# Patient Record
Sex: Female | Born: 1950 | Race: White | Hispanic: No | Smoking: Former smoker
Health system: Southern US, Community
[De-identification: ages and names within clinical notes are randomized; demographics above are authoritative.]

## PROBLEM LIST (undated history)

## (undated) DIAGNOSIS — I809 Phlebitis and thrombophlebitis of unspecified site: Secondary | ICD-10-CM

## (undated) DIAGNOSIS — M810 Age-related osteoporosis without current pathological fracture: Secondary | ICD-10-CM

## (undated) DIAGNOSIS — I509 Heart failure, unspecified: Secondary | ICD-10-CM

## (undated) DIAGNOSIS — I4729 Other ventricular tachycardia: Secondary | ICD-10-CM

## (undated) DIAGNOSIS — I351 Nonrheumatic aortic (valve) insufficiency: Secondary | ICD-10-CM

## (undated) DIAGNOSIS — E079 Disorder of thyroid, unspecified: Secondary | ICD-10-CM

## (undated) DIAGNOSIS — J449 Chronic obstructive pulmonary disease, unspecified: Secondary | ICD-10-CM

## (undated) DIAGNOSIS — K219 Gastro-esophageal reflux disease without esophagitis: Secondary | ICD-10-CM

## (undated) DIAGNOSIS — I071 Rheumatic tricuspid insufficiency: Secondary | ICD-10-CM

## (undated) DIAGNOSIS — Z9889 Other specified postprocedural states: Secondary | ICD-10-CM

## (undated) DIAGNOSIS — M1990 Unspecified osteoarthritis, unspecified site: Secondary | ICD-10-CM

## (undated) DIAGNOSIS — Z8639 Personal history of other endocrine, nutritional and metabolic disease: Secondary | ICD-10-CM

## (undated) DIAGNOSIS — K2270 Barrett's esophagus without dysplasia: Secondary | ICD-10-CM

## (undated) DIAGNOSIS — F419 Anxiety disorder, unspecified: Secondary | ICD-10-CM

## (undated) DIAGNOSIS — D801 Nonfamilial hypogammaglobulinemia: Secondary | ICD-10-CM

## (undated) DIAGNOSIS — J45909 Unspecified asthma, uncomplicated: Secondary | ICD-10-CM

## (undated) DIAGNOSIS — E039 Hypothyroidism, unspecified: Secondary | ICD-10-CM

## (undated) HISTORY — PX: BREAST BIOPSY: SHX20

## (undated) HISTORY — PX: ANKLE SURGERY: SHX546

## (undated) HISTORY — PX: HEMORRHOID SURGERY: SHX153

## (undated) HISTORY — PX: TONSILLECTOMY: SUR1361

---

## 2004-11-05 HISTORY — PX: BREAST BIOPSY: SHX20

## 2010-05-15 ENCOUNTER — Ambulatory Visit: Payer: Self-pay | Admitting: Family Medicine

## 2010-05-24 ENCOUNTER — Ambulatory Visit: Payer: Self-pay | Admitting: Family Medicine

## 2010-11-18 ENCOUNTER — Inpatient Hospital Stay: Payer: Self-pay | Admitting: Internal Medicine

## 2011-02-12 ENCOUNTER — Ambulatory Visit: Payer: Self-pay | Admitting: Internal Medicine

## 2011-02-19 ENCOUNTER — Ambulatory Visit: Payer: Self-pay | Admitting: Internal Medicine

## 2011-02-27 ENCOUNTER — Ambulatory Visit: Payer: Self-pay | Admitting: Family Medicine

## 2011-04-04 ENCOUNTER — Ambulatory Visit: Payer: Self-pay | Admitting: Family Medicine

## 2011-04-05 ENCOUNTER — Emergency Department: Payer: Self-pay

## 2011-04-05 ENCOUNTER — Ambulatory Visit: Payer: Self-pay | Admitting: Internal Medicine

## 2013-07-11 ENCOUNTER — Emergency Department: Payer: Self-pay | Admitting: Emergency Medicine

## 2013-08-18 ENCOUNTER — Emergency Department: Payer: Self-pay | Admitting: Emergency Medicine

## 2013-08-18 LAB — URINALYSIS, COMPLETE
Bacteria: NONE SEEN
Bilirubin,UR: NEGATIVE
Glucose,UR: NEGATIVE mg/dL (ref 0–75)
Ketone: NEGATIVE
Leukocyte Esterase: NEGATIVE
Ph: 6 (ref 4.5–8.0)
Protein: NEGATIVE
RBC,UR: 1 /HPF (ref 0–5)
Specific Gravity: 1.008 (ref 1.003–1.030)
WBC UR: 1 /HPF (ref 0–5)

## 2013-08-18 LAB — COMPREHENSIVE METABOLIC PANEL
Albumin: 3.8 g/dL (ref 3.4–5.0)
Alkaline Phosphatase: 78 U/L (ref 50–136)
BUN: 10 mg/dL (ref 7–18)
Bilirubin,Total: 0.4 mg/dL (ref 0.2–1.0)
Co2: 30 mmol/L (ref 21–32)
EGFR (African American): >60
Osmolality: 279 (ref 275–301)
Potassium: 3.5 mmol/L (ref 3.5–5.1)
SGPT (ALT): 19 U/L (ref 12–78)

## 2013-08-18 LAB — CBC
HCT: 38.4 % (ref 35.0–47.0)
MCH: 30.8 pg (ref 26.0–34.0)
MCHC: 33.2 g/dL (ref 32.0–36.0)
MCV: 93 fL (ref 80–100)
Platelet: 205 10*3/uL (ref 150–440)
RBC: 4.14 10*6/uL (ref 3.80–5.20)
WBC: 14.6 10*3/uL — ABNORMAL HIGH (ref 3.6–11.0)

## 2013-08-18 LAB — LIPASE, BLOOD: Lipase: 162 U/L (ref 73–393)

## 2014-04-28 DIAGNOSIS — J302 Other seasonal allergic rhinitis: Secondary | ICD-10-CM | POA: Insufficient documentation

## 2014-04-28 DIAGNOSIS — J4520 Mild intermittent asthma, uncomplicated: Secondary | ICD-10-CM | POA: Insufficient documentation

## 2014-04-28 DIAGNOSIS — B009 Herpesviral infection, unspecified: Secondary | ICD-10-CM | POA: Insufficient documentation

## 2014-04-28 DIAGNOSIS — K2100 Gastro-esophageal reflux disease with esophagitis, without bleeding: Secondary | ICD-10-CM | POA: Insufficient documentation

## 2014-04-28 DIAGNOSIS — F419 Anxiety disorder, unspecified: Secondary | ICD-10-CM | POA: Insufficient documentation

## 2015-12-29 ENCOUNTER — Encounter: Payer: Self-pay | Admitting: *Deleted

## 2015-12-29 ENCOUNTER — Ambulatory Visit
Admission: EM | Admit: 2015-12-29 | Discharge: 2015-12-29 | Disposition: A | Discharge status: Home / Self Care | Payer: Medicare Other | Attending: Internal Medicine | Admitting: Internal Medicine

## 2015-12-29 DIAGNOSIS — S91114A Laceration without foreign body of right lesser toe(s) without damage to nail, initial encounter: Secondary | ICD-10-CM

## 2015-12-29 HISTORY — DX: Phlebitis and thrombophlebitis of unspecified site: I80.9

## 2015-12-29 HISTORY — DX: Barrett's esophagus without dysplasia: K22.70

## 2015-12-29 HISTORY — DX: Nonfamilial hypogammaglobulinemia: D80.1

## 2015-12-29 HISTORY — DX: Gastro-esophageal reflux disease without esophagitis: K21.9

## 2015-12-29 HISTORY — DX: Other specified postprocedural states: Z98.890

## 2015-12-29 HISTORY — DX: Unspecified asthma, uncomplicated: J45.909

## 2015-12-29 HISTORY — DX: Unspecified osteoarthritis, unspecified site: M19.90

## 2015-12-29 HISTORY — DX: Disorder of thyroid, unspecified: E07.9

## 2015-12-29 MED ORDER — CEPHALEXIN 250 MG PO CAPS: 250.0000 mg | ORAL_CAPSULE | Freq: Four times a day (QID) | ORAL | Status: DC

## 2015-12-29 NOTE — ED Provider Notes (Signed)
CSN: [id number]     Arrival date & time 12/29/15  1536 History   First MD Initiated Contact with Patient 12/29/15 1639     Chief Complaint  Patient presents with  . Extremity Laceration    right side   (Consider location/radiation/quality/duration/timing/severity/associated sxs/prior Treatment) HPI Comments: This is a 65 year old female presents to clinic complaining of a laceration to her right little toe after stubbing the extremity on framing materials that were on her floor. The accident occurred approximately 1 hours prior to arrival. Wound bled a moderate amount and the patient apply direct pressure with a clean towel dry her to coming to clinic.  The history is provided by the patient.    Past Medical History  Diagnosis Date  . Thyroid disease   . Asthma   . GERD (gastroesophageal reflux disease)   . Barrett esophagus   . Arthritis   . Hypogammaglobulinemia (HCC)   . Phlebitis   . History of vein stripping    Past Surgical History  Procedure Laterality Date  . Tonsillectomy    . Hemorrhoid surgery    . Ankle surgery     History reviewed. No pertinent family history. Social History  Substance Use Topics  . Smoking status: Former Games developer  . Smokeless tobacco: Never Used  . Alcohol Use: No   OB History    No data available     Review of Systems  Constitutional: Negative for fever.  Gastrointestinal: Negative for nausea, vomiting and diarrhea.    Allergies  Bee venom; Clindamycin/lincomycin; Erythromycin; Penicillins; Sodium tetradecyl sulfate; and Sulfa antibiotics  Home Medications   Prior to Admission medications   Medication Sig Start Date End Date Taking? Authorizing Provider  albuterol (PROVENTIL HFA;VENTOLIN HFA) 108 (90 Base) MCG/ACT inhaler Inhale 2 puffs into the lungs every 4 (four) hours as needed for wheezing or shortness of breath.   Yes Historical Provider, MD  calcium carbonate (OSCAL) 1500 (600 Ca) MG TABS tablet Take 1,500 mg by mouth 2 (two)  times daily with a meal.   Yes Historical Provider, MD  cyclobenzaprine (FLEXERIL) 5 MG tablet Take 5 mg by mouth 3 (three) times daily as needed for muscle spasms.   Yes Historical Provider, MD  diphenhydrAMINE (SOMINEX) 25 MG tablet Take 25 mg by mouth at bedtime as needed for sleep.   Yes Historical Provider, MD  fexofenadine (ALLEGRA) 60 MG tablet Take 60 mg by mouth daily.   Yes Historical Provider, MD  fluticasone (FLONASE) 50 MCG/ACT nasal spray Place 2 sprays into both nostrils daily.   Yes Historical Provider, MD  levothyroxine (SYNTHROID, LEVOTHROID) 100 MCG tablet Take 100 mcg by mouth daily before breakfast.   Yes Historical Provider, MD  montelukast (SINGULAIR) 10 MG tablet Take 10 mg by mouth at bedtime.   Yes Historical Provider, MD  omeprazole (PRILOSEC) 20 MG capsule Take 20 mg by mouth daily.   Yes Historical Provider, MD  sertraline (ZOLOFT) 50 MG tablet Take 50 mg by mouth daily.   Yes Historical Provider, MD  valACYclovir (VALTREX) 500 MG tablet Take 500 mg by mouth 2 (two) times daily.   Yes Historical Provider, MD  cephALEXin (KEFLEX) 250 MG capsule Take 1 capsule (250 mg total) by mouth 4 (four) times daily. 12/29/15   Arnaldo Natal, MD  EPINEPHrine (EPIPEN 2-PAK) 0.3 mg/0.3 mL IJ SOAJ injection Inject 0.3 mg into the muscle once.    Historical Provider, MD   Meds Ordered and Administered this Visit  Medications - No data to  display  BP 125/74 mmHg  Pulse 63  Temp(Src) 97.9 F (36.6 C) (Oral)  Resp 18  Ht  (1.651 m)  Wt 131 lb (59.421 kg)  BMI 21.80 kg/m2  SpO2 100% No data found.   Physical Exam  Constitutional: She is oriented to person, place, and time. She appears well-developed and well-nourished. No distress.  HENT:  Head: Normocephalic and atraumatic.  Mouth/Throat: Oropharynx is clear and moist.  Eyes: Conjunctivae and EOM are normal. Pupils are equal, round, and reactive to light. No scleral icterus.  Neck: Normal range of motion. Neck  supple. No JVD present. No tracheal deviation present. No thyromegaly present.  Cardiovascular: Normal rate, regular rhythm and normal heart sounds.  Exam reveals no gallop and no friction rub.   No murmur heard. Pulmonary/Chest: Effort normal and breath sounds normal.  Abdominal: Soft. Bowel sounds are normal. She exhibits no distension. There is no tenderness.  Genitourinary:  Deferred  Musculoskeletal: Normal range of motion. She exhibits no edema.  Lymphadenopathy:    She has no cervical adenopathy.  Neurological: She is alert and oriented to person, place, and time. No cranial nerve deficit.  Skin: Skin is warm and dry.  1 cm open laceration to lateral right fourth toe  Psychiatric: She has a normal mood and affect. Her behavior is normal. Judgment and thought content normal.  Nursing note and vitals reviewed.   ED Course  .Marland KitchenLaceration Repair Date/Time: 12/29/2015 5:05 PM Performed by: Arnaldo Natal Authorized by: Arnaldo Natal Consent: Verbal consent obtained. Written consent not obtained. Risks and benefits: risks, benefits and alternatives were discussed Consent given by: patient Patient understanding: patient states understanding of the procedure being performed Patient consent: the patient's understanding of the procedure matches consent given Procedure consent: procedure consent matches procedure scheduled Patient identity confirmed: verbally with patient Time out: Immediately prior to procedure a "time out" was called to verify the correct patient, procedure, equipment, support staff and site/side marked as required. Body area: lower extremity Location details: right little toe Laceration length: 1 cm Patient sedated: no Irrigation solution: saline Irrigation method: jet lavage Amount of cleaning: extensive Debridement: none Degree of undermining: none Skin closure: glue Approximation: close Approximation difficulty: simple Patient tolerance: Patient  tolerated the procedure well with no immediate complications Comments: Also irrigated with hydrogen peroxide   (including critical care time)  Labs Review Labs Reviewed - No data to display  Imaging Review No results found.   Visual Acuity Review  Right Eye Distance:   Left Eye Distance:   Bilateral Distance:    Right Eye Near:   Left Eye Near:    Bilateral Near:         MDM   1. Laceration of fourth toe, right, initial encounter    Wound thoroughly irrigated and it is approximated with Dermabond. Patient instructed to return to clinic for sinus symptoms of sepsis including fever, nausea, vomiting or diarrhea.    Arnaldo Natal, MD 12/29/15 [phone]

## 2015-12-29 NOTE — Discharge Instructions (Signed)
Laceration Care, Adult  A laceration is a cut that goes through all layers of the skin. The cut also goes into the tissue that is right under the skin. Some cuts heal on their own. Others need to be closed with stitches (sutures), staples, skin adhesive strips, or wound glue. Taking care of your cut lowers your risk of infection and helps your cut to heal better.  HOW TO TAKE CARE OF YOUR CUT  For stitches or staples:  · Keep the wound clean and dry.  · If you were given a bandage (dressing), you should change it at least one time per day or as told by your doctor. You should also change it if it gets wet or dirty.  · Keep the wound completely dry for the first 24 hours or as told by your doctor. After that time, you may take a shower or a bath. However, make sure that the wound is not soaked in water until after the stitches or staples have been removed.  · Clean the wound one time each day or as told by your doctor:    Wash the wound with soap and water.    Rinse the wound with water until all of the soap comes off.    Pat the wound dry with a clean towel. Do not rub the wound.  · After you clean the wound, put a thin layer of antibiotic ointment on it as told by your doctor. This ointment:    Helps to prevent infection.    Keeps the bandage from sticking to the wound.  · Have your stitches or staples removed as told by your doctor.  If your doctor used skin adhesive strips:   · Keep the wound clean and dry.  · If you were given a bandage, you should change it at least one time per day or as told by your doctor. You should also change it if it gets dirty or wet.  · Do not get the skin adhesive strips wet. You can take a shower or a bath, but be careful to keep the wound dry.  · If the wound gets wet, pat it dry with a clean towel. Do not rub the wound.  · Skin adhesive strips fall off on their own. You can trim the strips as the wound heals. Do not remove any strips that are still stuck to the wound. They will  fall off after a while.  If your doctor used wound glue:  · Try to keep your wound dry, but you may briefly wet it in the shower or bath. Do not soak the wound in water, such as by swimming.  · After you take a shower or a bath, gently pat the wound dry with a clean towel. Do not rub the wound.  · Do not do any activities that will make you really sweaty until the skin glue has fallen off on its own.  · Do not apply liquid, cream, or ointment medicine to your wound while the skin glue is still on.  · If you were given a bandage, you should change it at least one time per day or as told by your doctor. You should also change it if it gets dirty or wet.  · If a bandage is placed over the wound, do not let the tape for the bandage touch the skin glue.  · Do not pick at the glue. The skin glue usually stays on for 5-10 days. Then, it   falls off of the skin.  General Instructions   · To help prevent scarring, make sure to cover your wound with sunscreen whenever you are outside after stitches are removed, after adhesive strips are removed, or when wound glue stays in place and the wound is healed. Make sure to wear a sunscreen of at least 30 SPF.  · Take over-the-counter and prescription medicines only as told by your doctor.  · If you were given antibiotic medicine or ointment, take or apply it as told by your doctor. Do not stop using the antibiotic even if your wound is getting better.  · Do not scratch or pick at the wound.  · Keep all follow-up visits as told by your doctor. This is important.  · Check your wound every day for signs of infection. Watch for:    Redness, swelling, or pain.    Fluid, blood, or pus.  · Raise (elevate) the injured area above the level of your heart while you are sitting or lying down, if possible.  GET HELP IF:  · You got a tetanus shot and you have any of these problems at the injection site:    Swelling.    Very bad pain.    Redness.    Bleeding.  · You have a fever.  · A wound that was  closed breaks open.  · You notice a bad smell coming from your wound or your bandage.  · You notice something coming out of the wound, such as wood or glass.  · Medicine does not help your pain.  · You have more redness, swelling, or pain at the site of your wound.  · You have fluid, blood, or pus coming from your wound.  · You notice a change in the color of your skin near your wound.  · You need to change the bandage often because fluid, blood, or pus is coming from the wound.  · You start to have a new rash.  · You start to have numbness around the wound.  GET HELP RIGHT AWAY IF:  · You have very bad swelling around the wound.  · Your pain suddenly gets worse and is very bad.  · You notice painful lumps near the wound or on skin that is anywhere on your body.  · You have a red streak going away from your wound.  · The wound is on your hand or foot and you cannot move a finger or toe like you usually can.  · The wound is on your hand or foot and you notice that your fingers or toes look pale or bluish.     This information is not intended to replace advice given to you by your health care provider. Make sure you discuss any questions you have with your health care provider.     Document Released: 04/09/2008 Document Revised: 03/08/2015 Document Reviewed: 10/18/2014  Elsevier Interactive Patient Education ©2016 [employer]

## 2015-12-29 NOTE — ED Notes (Signed)
Patient was working with a picture glass frame when she dropped it on her right foot. Laceration present on outside of right foot proximal from small toe.

## 2016-10-16 ENCOUNTER — Ambulatory Visit (INDEPENDENT_AMBULATORY_CARE_PROVIDER_SITE_OTHER): Payer: Self-pay | Admitting: Vascular Surgery

## 2016-10-23 ENCOUNTER — Encounter (INDEPENDENT_AMBULATORY_CARE_PROVIDER_SITE_OTHER): Payer: Self-pay | Admitting: Vascular Surgery

## 2016-10-23 ENCOUNTER — Ambulatory Visit (INDEPENDENT_AMBULATORY_CARE_PROVIDER_SITE_OTHER): Payer: Medicare Other | Admitting: Vascular Surgery

## 2016-10-23 VITALS — BP 104/65 | HR 75 | Resp 17 | Ht 65.75 in | Wt 130.6 lb

## 2016-10-23 DIAGNOSIS — I83811 Varicose veins of right lower extremities with pain: Secondary | ICD-10-CM | POA: Diagnosis not present

## 2016-10-23 NOTE — Patient Instructions (Signed)
Varicose Vein Surgery Varicose vein surgery is a procedure to remove or repair varicose veins. These are swollen, twisted veins that are visible under the skin, especially in your legs. These veins may appear blue and bulging. All veins have a valve that keeps blood flowing in only one direction. If these valves get weak or damaged, blood can pool and cause varicose veins. You may have varicose vein surgery if your varicose veins are causing symptoms or complications, or if lifestyle changes have not helped. The surgery can reduce pain, aching, and the risk of bleeding and blood clots. Surgery can also improve the way the affected area looks (cosmetic appearance). The different types of varicose vein surgery include:  Injecting a chemical to close off a vein (sclerotherapy).  Treating a vein with light energy (laser treatment).  Using lasers or radio waves to close off a vein with heat (radiofrequency vein ablation).  Surgically removing the vein through a small incision (phlebectomy).  Surgically removing the vein through incisions after the vein has been tied off (vein ligation and stripping). Your health care provider will discuss the method that is best for you based on your condition. Tell a health care provider about:  Any allergies you have.  All medicines you are taking, including vitamins, herbs, eye drops, creams, and over-the-counter medicines.  Any problems you or family members have had with anesthetic medicines.  Any blood disorders you have.  Any surgeries you have had.  Any medical conditions you have. What are the risks? Generally, this is a safe procedure. However, problems can occur and include:  Damage to nearby nerves, tissues, or veins.  Sores.  Dark spots.  Skin irritation.  Numbness.  Clotting.  Infection.  Scarring.  Leg swelling.  Need for additional treatments. What happens before the procedure?  Ask your health care provider  about:  Changing or stopping your regular medicines. This is especially important if you are taking diabetes medicines or blood thinners.  Taking medicines such as aspirin and ibuprofen. These medicines can thin your blood. Do not take these medicines before your procedure if your health care provider instructs you not to.  You may have tests before varicose vein surgery. These can include a test to:  Check for clots and check blood flow using sound waves (Doppler ultrasound).  Observe how blood flows through your veins by injecting a dye that outlines your veins on X-rays (angiogram). This test is used in rare cases. What happens during the procedure? The procedure will vary depending on which type of varicose vein surgery you have: Sclerotherapy  This procedure is often used for small to medium veins.  A chemical (sclerosant) that irritates the lining of the vein will be injected into the vein. This will cause the varicose vein to be closed off.  Sclerosants in different amounts and strengths can be used, depending on the size and location of the vein.  You may need more than one treatment. Laser Treatment  This procedure does not involve incisions or chemicals.  Light energy from a laser will be directed onto the vein.  Laser treatment may be combined with sclerotherapy.  You may need more than one treatment. Radiofrequency Vein Ablation  You will be given a medicine that numbs the area (local anesthetic).  A small surgical cut (incision) will be made near the varicose vein.  A narrow tube (catheter) will be threaded into your vein.  The tip of the catheter will use either a laser or radio waves   to close the vein with heat. Phlebectomy  This surgical procedure is used to remove the veins closest to the skin.  You will be given a medicine that numbs the area (local anesthetic).  The surgeon will make a small puncture close to the varicose vein and then use a tiny hook  to pull out the vein. Vein Ligation and Stripping  This surgical procedure is used to treat severe cases.  For this procedure, you will be given a medicine that makes you go to sleep (general anesthetic).  The surgeon will make a small incision near the vein in your groin (saphenous vein).  First the surgeon will tie off (ligate) the vein.  Then the surgeon will make several more incisions along the vein.  The vein will be removed (stripped).  It may take 1-4 weeks to recover completely. What happens after the procedure?  Depending on the type of procedure that is performed, you may be able to return to your usual activities the day after the procedure.  You may have to wear compression stockings. These stockings help to prevent blood clots and reduce swelling in your legs. This information is not intended to replace advice given to you by your health care provider. Make sure you discuss any questions you have with your health care provider. Document Released: 11/11/2007 Document Revised: 03/29/2016 Document Reviewed: 03/31/2014 Elsevier Interactive Patient Education  2017 Else[employer] Inc.  

## 2016-10-23 NOTE — Progress Notes (Signed)
Patient ID: Lydia DakinDeborah L Rought, female   DOB: 03/27/1951, 65 y.o.   MRN: [id number]  Chief Complaint  Patient presents with  . Follow-up    HPI Lydia Camacho is a 65 y.o. female.  Patient returns in follow up of their venous disease.  They have done their best to comply with the prescribed conservative therapies of compression stockings, leg elevation, exercise, and still requires anti-inflammatories for discomfort and has symptoms that are persistent and bothersome on a daily basis, affecting their activities of daily living and normal activities.  The venous reflux study demonstrates Surgically absent great saphenous veins bilaterally, no deep venous thrombosis or superficial thrombophlebitis in either lower extremity, but a large anterior accessory saphenous vein on the right which demonstrates reflux. The right leg is by far the more symptomatic leg for her.   HPI  Past Medical History:  Diagnosis Date  . Arthritis   . Asthma   . Barrett esophagus   . GERD (gastroesophageal reflux disease)   . History of vein stripping   . Hypogammaglobulinemia (HCC)   . Phlebitis   . Thyroid disease     Past Surgical History:  Procedure Laterality Date  . ANKLE SURGERY    . HEMORRHOID SURGERY    . TONSILLECTOMY      No family history on file. No bleeding or clotting disorders  Social History Social History  Substance Use Topics  . Smoking status: Former Games developermoker  . Smokeless tobacco: Never Used  . Alcohol use No     Allergies  Allergen Reactions  . Bee Venom Anaphylaxis  . Clindamycin/Lincomycin Anaphylaxis  . Erythromycin Anaphylaxis  . Penicillins Anaphylaxis  . Sodium Tetradecyl Sulfate Anaphylaxis  . Sulfa Antibiotics Anaphylaxis    Current Outpatient Prescriptions  Medication Sig Dispense Refill  . albuterol (PROVENTIL HFA;VENTOLIN HFA) 108 (90 Base) MCG/ACT inhaler Inhale 2 puffs into the lungs every 4 (four) hours as needed for wheezing or shortness of breath.    .  calcium carbonate (OSCAL) 1500 (600 Ca) MG TABS tablet Take 1,500 mg by mouth 2 (two) times daily with a meal.    . cyclobenzaprine (FLEXERIL) 5 MG tablet Take 5 mg by mouth 3 (three) times daily as needed for muscle spasms.    . diphenhydrAMINE (SOMINEX) 25 MG tablet Take 25 mg by mouth at bedtime as needed for sleep.    Marland Kitchen. EPINEPHrine (EPIPEN 2-PAK) 0.3 mg/0.3 mL IJ SOAJ injection Inject 0.3 mg into the muscle once.    . fexofenadine (ALLEGRA) 60 MG tablet Take 60 mg by mouth daily.    . fluticasone (FLONASE) 50 MCG/ACT nasal spray Place 2 sprays into both nostrils daily.    Marland Kitchen. levothyroxine (SYNTHROID, LEVOTHROID) 100 MCG tablet Take 100 mcg by mouth daily before breakfast.    . montelukast (SINGULAIR) 10 MG tablet Take 10 mg by mouth at bedtime.    Marland Kitchen. omeprazole (PRILOSEC) 20 MG capsule Take 20 mg by mouth daily.    . Probiotic Product (PROBIOTIC DAILY PO) Take by mouth daily.    . sertraline (ZOLOFT) 50 MG tablet Take 50 mg by mouth daily.    . valACYclovir (VALTREX) 500 MG tablet Take 500 mg by mouth 2 (two) times daily.    . cephALEXin (KEFLEX) 250 MG capsule Take 1 capsule (250 mg total) by mouth 4 (four) times daily. (Patient not taking: Reported on 10/23/2016) 28 capsule 0   No current facility-administered medications for this visit.       REVIEW OF  SYSTEMS (Negative unless checked)  Constitutional: [] Weight loss  [] Fever  [] Chills Cardiac: [] Chest pain   [] Chest pressure   [] Palpitations   [] Shortness of breath when laying flat   [] Shortness of breath at rest   [] Shortness of breath with exertion. Vascular:  [] Pain in legs with walking   [] Pain in legs at rest   [] Pain in legs when laying flat   [] Claudication   [] Pain in feet when walking  [] Pain in feet at rest  [] Pain in feet when laying flat   [] History of DVT   [] Phlebitis   [] Swelling in legs   [x] Varicose veins   [] Non-healing ulcers Pulmonary:   [] Uses home oxygen   [] Productive cough   [] Hemoptysis   [] Wheeze  [] COPD    [] Asthma Neurologic:  [] Dizziness  [] Blackouts   [] Seizures   [] History of stroke   [] History of TIA  [] Aphasia   [] Temporary blindness   [] Dysphagia   [] Weakness or numbness in arms   [] Weakness or numbness in legs Musculoskeletal:  [] Arthritis   [] Joint swelling   [] Joint pain   [] Low back pain Hematologic:  [] Easy bruising  [] Easy bleeding   [] Hypercoagulable state   [] Anemic  [] Hepatitis Gastrointestinal:  [] Blood in stool   [] Vomiting blood  [] Gastroesophageal reflux/heartburn   [] Abdominal pain Genitourinary:  [] Chronic kidney disease   [] Difficult urination  [] Frequent urination  [] Burning with urination   [] Hematuria Skin:  [] Rashes   [] Ulcers   [] Wounds Psychological:  [] History of anxiety   []  History of major depression.    Physical Exam BP 104/65   Pulse 75   Resp 17   Ht 5' 5.75" (1.67 m)   Wt 130 lb 9.6 oz (59.2 kg)   BMI 21.24 kg/m  Gen:  Thin, NAD Head: [place]/AT, No temporalis wasting.  Ear/Nose/Throat: Hearing grossly intact Eyes: Sclera non-icteric. Conjunctiva clear Neck: Supple. Trachea midline Pulmonary:  Good air movement, no use of accessory muscles, respirations not labored.  Cardiac: RRR, No JVD Vascular: Varicosities extensive and measuring up to 4 mm in the right lower extremity        Varicosities scattered and measuring up to 2 mm in the left lower extremity Vessel Right Left  Radial Palpable Palpable                                   Gastrointestinal: soft, non-tender/non-distended. No guarding/reflex. No masses, surgical incisions, or scars. Musculoskeletal: M/S 5/5 throughout.   No significant LE edema Neurologic: Sensation grossly intact in extremities.  Symmetrical.  Speech is fluent.  Psychiatric: Judgment intact, Mood & affect appropriate for pt's clinical situation. Dermatologic: No rashes or ulcers noted.  No cellulitis or open wounds. Lymph : No Cervical, Axillary, or Inguinal lymphadenopathy.   Radiology No results  found.  Labs No results found for this or any previous visit (from the past 2160 hour(s)).  Assessment/Plan:  Varicose veins of leg with pain, right See below.   The patient has done their best to comply with conservative therapy of 20-30 mm Hg compression stockings, leg elevation, exercise, and anti-inflammatories as needed for discomfort.  Despite this, they continue to have daily and persistent symptoms from their venous disease.  A venous reflux study demonstrates reflux and a large anterior accessory saphenous vein on the right. Her great saphenous veins are surgically absent bilaterally..  As such, the patient is likely to benefit from foam sclerotherapy of the right anterior accessory saphenous  vein. These are not typically amenable to laser ablation due to their superficial nature in a tortuous vein.  Risks and benefits of the procedure including bleeding, infection, recanalization, DVT, and need for further therapy for residual varicosities were discussed.  The patient voices their understanding and is agreeable to proceed with foam sclerotherapy of the right anterior accessory saphenous vein.     Festus BarrenJason [place] Current 10/23/2016, 4:23 PM

## 2016-10-23 NOTE — Assessment & Plan Note (Signed)
See below

## 2016-11-09 ENCOUNTER — Ambulatory Visit (INDEPENDENT_AMBULATORY_CARE_PROVIDER_SITE_OTHER): Payer: Medicare Other | Admitting: Vascular Surgery

## 2016-11-13 DIAGNOSIS — E538 Deficiency of other specified B group vitamins: Secondary | ICD-10-CM | POA: Insufficient documentation

## 2016-11-16 ENCOUNTER — Ambulatory Visit (INDEPENDENT_AMBULATORY_CARE_PROVIDER_SITE_OTHER): Payer: Medicare Other | Admitting: Vascular Surgery

## 2016-11-16 ENCOUNTER — Encounter (INDEPENDENT_AMBULATORY_CARE_PROVIDER_SITE_OTHER): Payer: Self-pay | Admitting: Vascular Surgery

## 2016-11-16 VITALS — BP 125/72 | HR 75 | Resp 16 | Wt 131.0 lb

## 2016-11-16 DIAGNOSIS — I83811 Varicose veins of right lower extremities with pain: Secondary | ICD-10-CM

## 2016-11-16 NOTE — Assessment & Plan Note (Signed)
see sclerotherapy note

## 2016-11-16 NOTE — Progress Notes (Signed)
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 Arthritis   . Asthma   . Barrett esophagus   . GERD (gastroesophageal reflux disease)   . History of vein stripping   . Hypogammaglobulinemia (HCC)   . Phlebitis   . Thyroid disease     Past Surgical History:  Procedure Laterality Date  . ANKLE SURGERY    . HEMORRHOID SURGERY    . TONSILLECTOMY      Current Outpatient Prescriptions  Medication Sig Dispense Refill  . albuterol (PROVENTIL HFA;VENTOLIN HFA) 108 (90 Base) MCG/ACT inhaler Inhale 2 puffs into the lungs every 4 (four) hours as needed for wheezing or shortness of breath.    . calcium carbonate (OSCAL) 1500 (600 Ca) MG TABS tablet Take 1,500 mg by mouth 2 (two) times daily with a meal.    . cephALEXin (KEFLEX) 250 MG capsule Take 1 capsule (250 mg total) by mouth 4 (four) times daily. 28 capsule 0  . cyclobenzaprine (FLEXERIL) 5 MG tablet Take 5 mg by mouth 3 (three) times daily as needed for muscle spasms.    . diphenhydrAMINE (SOMINEX) 25 MG tablet Take 25 mg by mouth at bedtime as needed for sleep.    Marland Kitchen. EPINEPHrine (EPIPEN 2-PAK) 0.3 mg/0.3 mL IJ SOAJ injection Inject 0.3 mg into the muscle once.    . fexofenadine (ALLEGRA) 60 MG tablet Take 60 mg by mouth daily.    . fluticasone (FLONASE) 50 MCG/ACT nasal spray Place 2 sprays into both nostrils daily.    Marland Kitchen. levothyroxine (SYNTHROID, LEVOTHROID) 100 MCG tablet Take 100 mcg by mouth daily before breakfast.    . montelukast (SINGULAIR) 10 MG tablet Take 10 mg by mouth at bedtime.    Marland Kitchen. omeprazole (PRILOSEC) 20 MG capsule Take 20 mg by mouth daily.    . Probiotic Product (PROBIOTIC DAILY PO) Take by mouth daily.    . sertraline (ZOLOFT) 50 MG tablet Take 50 mg by mouth daily.    . valACYclovir (VALTREX) 500 MG tablet Take 500 mg by mouth 2 (two) times daily.     No current facility-administered medications for this visit.     Allergies   Allergen Reactions  . Bee Venom Anaphylaxis  . Clindamycin/Lincomycin Anaphylaxis  . Erythromycin Anaphylaxis  . Penicillins Anaphylaxis  . Sodium Tetradecyl Sulfate Anaphylaxis  . Sulfa Antibiotics Anaphylaxis    Indication: Patient presents with symptomatic varicose veins of the right lower extremity.  Procedure: Foam sclerotherapy was performed on the right lower extremity. Using ultrasound guidance, 5 mL of foamed Hypertonic saline and lidocaine was used to inject the varicosities of the right lower extremity. Compression wraps were placed. The patient tolerated the procedure well.

## 2016-12-07 ENCOUNTER — Ambulatory Visit (INDEPENDENT_AMBULATORY_CARE_PROVIDER_SITE_OTHER): Payer: Medicare Other | Admitting: Vascular Surgery

## 2016-12-07 ENCOUNTER — Encounter (INDEPENDENT_AMBULATORY_CARE_PROVIDER_SITE_OTHER): Payer: Self-pay | Admitting: Vascular Surgery

## 2016-12-07 VITALS — BP 119/64 | HR 67 | Resp 16 | Wt 130.8 lb

## 2016-12-07 DIAGNOSIS — I83811 Varicose veins of right lower extremities with pain: Secondary | ICD-10-CM

## 2016-12-07 NOTE — Progress Notes (Signed)
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 Arthritis   . Asthma   . Barrett esophagus   . GERD (gastroesophageal reflux disease)   . History of vein stripping   . Hypogammaglobulinemia (HCC)   . Phlebitis   . Thyroid disease     Past Surgical History:  Procedure Laterality Date  . ANKLE SURGERY    . HEMORRHOID SURGERY    . TONSILLECTOMY      Current Outpatient Prescriptions  Medication Sig Dispense Refill  . albuterol (PROVENTIL HFA;VENTOLIN HFA) 108 (90 Base) MCG/ACT inhaler Inhale 2 puffs into the lungs every 4 (four) hours as needed for wheezing or shortness of breath.    . calcium carbonate (OSCAL) 1500 (600 Ca) MG TABS tablet Take 1,500 mg by mouth 2 (two) times daily with a meal.    . cephALEXin (KEFLEX) 250 MG capsule Take 1 capsule (250 mg total) by mouth 4 (four) times daily. 28 capsule 0  . cyclobenzaprine (FLEXERIL) 5 MG tablet Take 5 mg by mouth 3 (three) times daily as needed for muscle spasms.    . diphenhydrAMINE (SOMINEX) 25 MG tablet Take 25 mg by mouth at bedtime as needed for sleep.    Marland Kitchen. EPINEPHrine (EPIPEN 2-PAK) 0.3 mg/0.3 mL IJ SOAJ injection Inject 0.3 mg into the muscle once.    . fexofenadine (ALLEGRA) 60 MG tablet Take 60 mg by mouth daily.    . fluticasone (FLONASE) 50 MCG/ACT nasal spray Place 2 sprays into both nostrils daily.    Marland Kitchen. levothyroxine (SYNTHROID, LEVOTHROID) 100 MCG tablet Take 100 mcg by mouth daily before breakfast.    . montelukast (SINGULAIR) 10 MG tablet Take 10 mg by mouth at bedtime.    . montelukast (SINGULAIR) 10 MG tablet Take 10 mg by mouth.    Marland Kitchen. omeprazole (PRILOSEC) 20 MG capsule Take 20 mg by mouth daily.    . Probiotic Product (PROBIOTIC DAILY PO) Take by mouth daily.    . sertraline (ZOLOFT) 50 MG tablet Take 50 mg by mouth daily.    . valACYclovir (VALTREX) 500 MG tablet Take 500 mg by mouth 2 (two) times daily.    . vitamin B-12  (CYANOCOBALAMIN) 1000 MCG tablet Take 500 mcg by mouth.      No current facility-administered medications for this visit.     Allergies  Allergen Reactions  . Bee Venom Anaphylaxis  . Clindamycin/Lincomycin Anaphylaxis  . Erythromycin Anaphylaxis  . Penicillins Anaphylaxis  . Sodium Tetradecyl Sulfate Anaphylaxis  . Sulfa Antibiotics Anaphylaxis    Indication: Patient presents with symptomatic varicose veins of the right lower extremity.  Procedure: Foam sclerotherapy was performed on the right lower extremity. Using ultrasound guidance, 5 mL of foamed hypertonic saline as well as regular hypertonic saline was used to inject the varicosities of the right lower extremity. Compression wraps were placed. The patient tolerated the procedure well.

## 2016-12-07 NOTE — Assessment & Plan Note (Signed)
See sclero note 

## 2017-01-01 ENCOUNTER — Ambulatory Visit (INDEPENDENT_AMBULATORY_CARE_PROVIDER_SITE_OTHER): Payer: Medicare Other | Admitting: Vascular Surgery

## 2017-04-12 ENCOUNTER — Ambulatory Visit (INDEPENDENT_AMBULATORY_CARE_PROVIDER_SITE_OTHER): Payer: Medicare Other | Admitting: Vascular Surgery

## 2017-04-12 DIAGNOSIS — I83811 Varicose veins of right lower extremities with pain: Secondary | ICD-10-CM

## 2017-04-12 NOTE — Progress Notes (Signed)
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 Arthritis   . Asthma   . Barrett esophagus   . GERD (gastroesophageal reflux disease)   . History of vein stripping   . Hypogammaglobulinemia (HCC)   . Phlebitis   . Thyroid disease     Past Surgical History:  Procedure Laterality Date  . ANKLE SURGERY    . HEMORRHOID SURGERY    . TONSILLECTOMY      Current Outpatient Prescriptions  Medication Sig Dispense Refill  . albuterol (PROVENTIL HFA;VENTOLIN HFA) 108 (90 Base) MCG/ACT inhaler Inhale 2 puffs into the lungs every 4 (four) hours as needed for wheezing or shortness of breath.    . calcium carbonate (OSCAL) 1500 (600 Ca) MG TABS tablet Take 1,500 mg by mouth 2 (two) times daily with a meal.    . cephALEXin (KEFLEX) 250 MG capsule Take 1 capsule (250 mg total) by mouth 4 (four) times daily. 28 capsule 0  . cyclobenzaprine (FLEXERIL) 5 MG tablet Take 5 mg by mouth 3 (three) times daily as needed for muscle spasms.    . diphenhydrAMINE (SOMINEX) 25 MG tablet Take 25 mg by mouth at bedtime as needed for sleep.    Marland Kitchen. EPINEPHrine (EPIPEN 2-PAK) 0.3 mg/0.3 mL IJ SOAJ injection Inject 0.3 mg into the muscle once.    . fexofenadine (ALLEGRA) 60 MG tablet Take 60 mg by mouth daily.    . fluticasone (FLONASE) 50 MCG/ACT nasal spray Place 2 sprays into both nostrils daily.    Marland Kitchen. levothyroxine (SYNTHROID, LEVOTHROID) 100 MCG tablet Take 100 mcg by mouth daily before breakfast.    . montelukast (SINGULAIR) 10 MG tablet Take 10 mg by mouth at bedtime.    . montelukast (SINGULAIR) 10 MG tablet Take 10 mg by mouth.    Marland Kitchen. omeprazole (PRILOSEC) 20 MG capsule Take 20 mg by mouth daily.    . Probiotic Product (PROBIOTIC DAILY PO) Take by mouth daily.    . sertraline (ZOLOFT) 50 MG tablet Take 50 mg by mouth daily.    . valACYclovir (VALTREX) 500 MG tablet Take 500 mg by mouth 2 (two) times daily.    . vitamin B-12  (CYANOCOBALAMIN) 1000 MCG tablet Take 500 mcg by mouth.      No current facility-administered medications for this visit.     Allergies  Allergen Reactions  . Bee Venom Anaphylaxis  . Clindamycin/Lincomycin Anaphylaxis  . Erythromycin Anaphylaxis  . Penicillins Anaphylaxis  . Sodium Tetradecyl Sulfate Anaphylaxis  . Sulfa Antibiotics Anaphylaxis    Indication: Patient presents with symptomatic varicose veins of the right lower extremity.  Procedure: Foam sclerotherapy was performed on the right lower extremity. Using ultrasound guidance, 5 mL of foamed Hypertonic saline was used to inject the varicosities of the right lower extremity. Compression wraps were placed. The patient tolerated the procedure well.

## 2018-05-14 DIAGNOSIS — Z789 Other specified health status: Secondary | ICD-10-CM | POA: Insufficient documentation

## 2018-10-28 ENCOUNTER — Other Ambulatory Visit: Payer: Self-pay

## 2018-10-28 ENCOUNTER — Ambulatory Visit
Admission: EM | Admit: 2018-10-28 | Discharge: 2018-10-28 | Disposition: A | Discharge status: Home / Self Care | Payer: Medicare Other | Attending: Family Medicine | Admitting: Family Medicine

## 2018-10-28 DIAGNOSIS — S61421A Laceration with foreign body of right hand, initial encounter: Secondary | ICD-10-CM

## 2018-10-28 DIAGNOSIS — Z23 Encounter for immunization: Secondary | ICD-10-CM | POA: Diagnosis not present

## 2018-10-28 DIAGNOSIS — S61411A Laceration without foreign body of right hand, initial encounter: Secondary | ICD-10-CM | POA: Insufficient documentation

## 2018-10-28 MED ORDER — TETANUS-DIPHTH-ACELL PERTUSSIS 5-2.5-18.5 LF-MCG/0.5 IM SUSP
0.5000 mL | Freq: Once | INTRAMUSCULAR | Status: AC
  Administered 2018-10-28: 0.5 mL via INTRAMUSCULAR

## 2018-10-28 NOTE — ED Provider Notes (Signed)
[facility]    CSN: [id number] Arrival date & time: 10/28/18  1356     History   Chief Complaint Chief Complaint  Patient presents with  . Extremity Laceration    right hand    HPI Lydia Camacho is a 67 y.o. female.   67 yo female with a c/o right hand laceration to back of hand about 1 hour ago after cutting it with some glass while cleaning her art room. Patient states she's injured that hand in the past requiring surgery.   The history is provided by the patient.    Past Medical History:  Diagnosis Date  . Arthritis   . Asthma   . Barrett esophagus   . GERD (gastroesophageal reflux disease)   . History of vein stripping   . Hypogammaglobulinemia (HCC)   . Phlebitis   . Thyroid disease     Patient Active Problem List   Diagnosis Date Noted  . Varicose veins of leg with pain, right 10/23/2016    Past Surgical History:  Procedure Laterality Date  . ANKLE SURGERY    . HEMORRHOID SURGERY    . TONSILLECTOMY      OB History   No obstetric history on file.      Home Medications    Prior to Admission medications   Medication Sig Start Date End Date Taking? Authorizing Provider  albuterol (PROVENTIL HFA;VENTOLIN HFA) 108 (90 Base) MCG/ACT inhaler Inhale 2 puffs into the lungs every 4 (four) hours as needed for wheezing or shortness of breath.   Yes [provider]  diphenhydrAMINE (SOMINEX) 25 MG tablet Take 25 mg by mouth at bedtime as needed for sleep.   Yes [provider]  EPINEPHrine (EPIPEN 2-PAK) 0.3 mg/0.3 mL IJ SOAJ injection Inject 0.3 mg into the muscle once.   Yes [provider]  fexofenadine (ALLEGRA) 60 MG tablet Take 60 mg by mouth daily.   Yes [provider]  fluticasone (FLONASE) 50 MCG/ACT nasal spray Place 2 sprays into both nostrils daily.   Yes [provider]  levothyroxine (SYNTHROID, LEVOTHROID) 100 MCG tablet Take 100 mcg by mouth daily before breakfast.   Yes [provider]  Misc Natural Products (T-RELIEF CBD+13) SUBL Place under the tongue.   Yes [provider]  montelukast (SINGULAIR) 10 MG tablet Take 10 mg by mouth at bedtime.   Yes [provider]  pantoprazole (PROTONIX) 40 MG tablet Take 40 mg by mouth daily.   Yes [provider]  Probiotic Product (PROBIOTIC DAILY PO) Take by mouth daily.   Yes [provider]  sertraline (ZOLOFT) 50 MG tablet Take 50 mg by mouth daily.   Yes [provider]  Turmeric 500 MG CAPS Take by mouth.   Yes [provider]  valACYclovir (VALTREX) 500 MG tablet Take 500 mg by mouth 2 (two) times daily.   Yes [provider]  vitamin B-12 (CYANOCOBALAMIN) 1000 MCG tablet Take 500 mcg by mouth.    Yes [provider]  calcium carbonate (OSCAL) 1500 (600 Ca) MG TABS tablet Take 1,500 mg by mouth 2 (two) times daily with a meal.    [provider]  cephALEXin (KEFLEX) 250 MG capsule Take 1 capsule (250 mg total) by mouth 4 (four) times daily. 12/29/15   Arnaldo Nataliamond, Michael S, MD  cyclobenzaprine (FLEXERIL) 5 MG tablet Take 5 mg by mouth 3 (three) times daily as needed for muscle spasms.    [provider]  montelukast (  SINGULAIR) 10 MG tablet Take 10 mg by mouth. 05/05/15   [provider]  omeprazole (PRILOSEC) 20 MG capsule Take 20 mg by mouth daily.    [provider]    Family History History reviewed. No pertinent family history.  Social History Social History   Tobacco Use  . Smoking status: Former Games developer  . Smokeless tobacco: Never Used  Substance Use Topics  . Alcohol use: No  . Drug use: No     Allergies   Bee venom; Clindamycin/lincomycin; Erythromycin; Penicillins; Sodium tetradecyl sulfate; and Sulfa antibiotics   Review of Systems Review of Systems   Physical Exam Triage Vital Signs ED Triage Vitals  Enc Vitals Group     BP 10/28/18 1404 132/73     Pulse Rate 10/28/18 1404 78      Resp 10/28/18 1404 18     Temp 10/28/18 1404 97.9 F (36.6 C)     Temp Source 10/28/18 1404 Oral     SpO2 10/28/18 1404 99 %     Weight 10/28/18 1403 127 lb (57.6 kg)     Height 10/28/18 1403 5' 4.75" (1.645 m)     Head Circumference --      Peak Flow --      Pain Score 10/28/18 1402 5     Pain Loc --      Pain Edu? --      Excl. in GC? --    No data found.  Updated Vital Signs BP 132/73 (BP Location: Left Arm)   Pulse 78   Temp 97.9 F (36.6 C) (Oral)   Resp 18   Ht 5' 4.75" (1.645 m)   Wt 57.6 kg   SpO2 99%   BMI 21.30 kg/m   Visual Acuity Right Eye Distance:   Left Eye Distance:   Bilateral Distance:    Right Eye Near:   Left Eye Near:    Bilateral Near:     Physical Exam Vitals signs and nursing note reviewed.  Constitutional:      General: She is not in acute distress.    Appearance: Normal appearance. She is not ill-appearing, toxic-appearing or diaphoretic.  Musculoskeletal:     Right hand: She exhibits laceration (approx 3cm on dorsum of hand over the 3rd metacarpal; tendon visible with decrease strength on extension). Normal sensation noted. Decreased strength (middle finger extension) noted.  Neurological:     Mental Status: She is alert.      UC Treatments / Results  Labs (all labs ordered are listed, but only abnormal results are displayed) Labs Reviewed - No data to display  EKG None  Radiology No results found.  Procedures Procedures (including critical care time)  Medications Ordered in UC Medications  Tdap (BOOSTRIX) injection 0.5 mL (0.5 mLs Intramuscular Given 10/28/18 1443)    Initial Impression / Assessment and Plan / UC Course  I have reviewed the triage vital signs and the nursing notes.  Pertinent labs & imaging results that were available during my care of the patient were reviewed by me and considered in my medical decision making (see chart for details).      Final Clinical Impressions(s) / UC Diagnoses   Final  diagnoses:  Laceration of right hand, foreign body presence unspecified, initial encounter     Discharge Instructions     Recommend patient go to Emergency Department for further evaluation and management    ED Prescriptions    None     1. diagnosis reviewed with patient; possible  tendon involvement (weakness on exam); recommend patient go to Emergency Department for further evaluation and management; patient given Tdap today  Controlled Substance Prescriptions [place] Controlled Substance Registry consulted? Not Applicable   Payton Mccallumonty, Crestina Strike, MD 10/28/18 [phone]

## 2018-10-28 NOTE — Discharge Instructions (Signed)
Recommend patient go to Emergency Department for further evaluation and management °

## 2018-10-28 NOTE — ED Triage Notes (Signed)
Patient reports that she cut her right hand on glass today around 145pm. Patient states that she was cleaning up her art room at her house and cut it on a glass frame.

## 2018-12-01 ENCOUNTER — Other Ambulatory Visit: Payer: Self-pay | Admitting: Internal Medicine

## 2018-12-01 DIAGNOSIS — Z1231 Encounter for screening mammogram for malignant neoplasm of breast: Secondary | ICD-10-CM

## 2018-12-15 ENCOUNTER — Ambulatory Visit
Admission: RE | Admit: 2018-12-15 | Discharge: 2018-12-15 | Disposition: A | Discharge status: Home / Self Care | Payer: Medicare Other | Source: Ambulatory Visit | Attending: Internal Medicine | Admitting: Internal Medicine

## 2018-12-15 ENCOUNTER — Encounter (INDEPENDENT_AMBULATORY_CARE_PROVIDER_SITE_OTHER): Payer: Self-pay

## 2018-12-15 DIAGNOSIS — Z1231 Encounter for screening mammogram for malignant neoplasm of breast: Secondary | ICD-10-CM | POA: Diagnosis present

## 2018-12-15 DIAGNOSIS — R590 Localized enlarged lymph nodes: Secondary | ICD-10-CM | POA: Insufficient documentation

## 2019-05-20 DIAGNOSIS — R600 Localized edema: Secondary | ICD-10-CM | POA: Insufficient documentation

## 2019-05-26 DIAGNOSIS — I340 Nonrheumatic mitral (valve) insufficiency: Secondary | ICD-10-CM | POA: Insufficient documentation

## 2019-07-08 DIAGNOSIS — E559 Vitamin D deficiency, unspecified: Secondary | ICD-10-CM | POA: Insufficient documentation

## 2019-07-08 DIAGNOSIS — M8589 Other specified disorders of bone density and structure, multiple sites: Secondary | ICD-10-CM | POA: Insufficient documentation

## 2019-10-19 ENCOUNTER — Telehealth: Payer: Self-pay | Admitting: *Deleted

## 2019-10-19 DIAGNOSIS — R072 Precordial pain: Secondary | ICD-10-CM

## 2019-10-19 DIAGNOSIS — I519 Heart disease, unspecified: Secondary | ICD-10-CM

## 2019-10-19 DIAGNOSIS — I429 Cardiomyopathy, unspecified: Secondary | ICD-10-CM

## 2019-10-19 DIAGNOSIS — R0789 Other chest pain: Secondary | ICD-10-CM

## 2019-10-19 NOTE — Telephone Encounter (Signed)
Outside request/referral sent by Halford Decamp PA with [facility] at [facility] clinic, for this pt to have a Coronary CT done and read by Dr. Johnsie Cancel, and results should be called to and sent to Halford Decamp PA at [facility] at [phone], once study read by CT reader Dr. Johnsie Cancel.  Order placed and our CT scheduler Rhodia Albright, to call and arrange Coronary CT appt date and time.

## 2019-10-28 ENCOUNTER — Encounter (HOSPITAL_COMMUNITY): Payer: Self-pay

## 2019-10-28 ENCOUNTER — Telehealth (HOSPITAL_COMMUNITY): Payer: Self-pay | Admitting: Emergency Medicine

## 2019-10-28 NOTE — Telephone Encounter (Signed)
Reaching out to patient to offer assistance regarding upcoming cardiac imaging study; pt verbalizes understanding of appt date/time, parking situation and where to check in, pre-test NPO status and medications ordered, and verified current allergies; name and call back number provided for further questions should they arise Marchia Bond RN Navigator Cardiac Imaging [facility] [phone] office [phone] cell  Pt with thyroid nodules, requesting thyroid guard during acquisition

## 2019-11-02 ENCOUNTER — Ambulatory Visit
Admission: RE | Admit: 2019-11-02 | Discharge: 2019-11-02 | Disposition: A | Discharge status: Home / Self Care | Payer: Medicare Other | Source: Ambulatory Visit | Attending: Cardiology | Admitting: Cardiology

## 2019-11-02 ENCOUNTER — Other Ambulatory Visit: Payer: Self-pay

## 2019-11-02 DIAGNOSIS — I429 Cardiomyopathy, unspecified: Secondary | ICD-10-CM | POA: Diagnosis not present

## 2019-11-02 DIAGNOSIS — R0789 Other chest pain: Secondary | ICD-10-CM | POA: Insufficient documentation

## 2019-11-02 DIAGNOSIS — R072 Precordial pain: Secondary | ICD-10-CM | POA: Insufficient documentation

## 2019-11-02 DIAGNOSIS — I519 Heart disease, unspecified: Secondary | ICD-10-CM

## 2019-11-02 HISTORY — DX: Heart failure, unspecified: I50.9

## 2019-11-02 LAB — POCT I-STAT CREATININE: Creatinine, Ser: 0.9 mg/dL (ref 0.44–1.00)

## 2019-11-02 MED ORDER — IOHEXOL 350 MG/ML SOLN
75.0000 mL | Freq: Once | INTRAVENOUS | Status: AC | PRN
  Administered 2019-11-02: 75 mL via INTRAVENOUS

## 2019-11-02 MED ORDER — METOPROLOL TARTRATE 5 MG/5ML IV SOLN
5.0000 mg | INTRAVENOUS | Status: DC | PRN
  Administered 2019-11-02: 5 mg via INTRAVENOUS

## 2019-11-02 MED ORDER — NITROGLYCERIN 0.4 MG SL SUBL
0.8000 mg | SUBLINGUAL_TABLET | Freq: Once | SUBLINGUAL | Status: AC
  Administered 2019-11-02: 0.8 mg via SUBLINGUAL

## 2019-11-02 NOTE — Progress Notes (Signed)
Patient tolerated CT without incident. Drank coffee after. Ambulatory to exit steady gait.  

## 2019-12-08 DIAGNOSIS — I429 Cardiomyopathy, unspecified: Secondary | ICD-10-CM | POA: Insufficient documentation

## 2019-12-08 DIAGNOSIS — E041 Nontoxic single thyroid nodule: Secondary | ICD-10-CM | POA: Insufficient documentation

## 2020-03-24 DIAGNOSIS — I83893 Varicose veins of bilateral lower extremities with other complications: Secondary | ICD-10-CM | POA: Insufficient documentation

## 2020-10-05 ENCOUNTER — Other Ambulatory Visit: Payer: Self-pay

## 2020-10-05 ENCOUNTER — Emergency Department: Payer: No Typology Code available for payment source

## 2020-10-05 ENCOUNTER — Emergency Department
Admission: EM | Admit: 2020-10-05 | Discharge: 2020-10-05 | Disposition: A | Discharge status: Home / Self Care | Payer: No Typology Code available for payment source | Attending: Emergency Medicine | Admitting: Emergency Medicine

## 2020-10-05 ENCOUNTER — Encounter: Payer: Self-pay | Admitting: Emergency Medicine

## 2020-10-05 DIAGNOSIS — Y92410 Unspecified street and highway as the place of occurrence of the external cause: Secondary | ICD-10-CM | POA: Diagnosis not present

## 2020-10-05 DIAGNOSIS — Z79899 Other long term (current) drug therapy: Secondary | ICD-10-CM | POA: Insufficient documentation

## 2020-10-05 DIAGNOSIS — I509 Heart failure, unspecified: Secondary | ICD-10-CM | POA: Insufficient documentation

## 2020-10-05 DIAGNOSIS — S060X0A Concussion without loss of consciousness, initial encounter: Secondary | ICD-10-CM | POA: Diagnosis not present

## 2020-10-05 DIAGNOSIS — Z87891 Personal history of nicotine dependence: Secondary | ICD-10-CM | POA: Diagnosis not present

## 2020-10-05 DIAGNOSIS — J45909 Unspecified asthma, uncomplicated: Secondary | ICD-10-CM | POA: Diagnosis not present

## 2020-10-05 DIAGNOSIS — Z7951 Long term (current) use of inhaled steroids: Secondary | ICD-10-CM | POA: Insufficient documentation

## 2020-10-05 DIAGNOSIS — S0990XA Unspecified injury of head, initial encounter: Secondary | ICD-10-CM | POA: Diagnosis present

## 2020-10-05 LAB — CBC WITH DIFFERENTIAL/PLATELET
Abs Immature Granulocytes: 0.01 10*3/uL (ref 0.00–0.07)
Basophils Absolute: 0.1 10*3/uL (ref 0.0–0.1)
Basophils Relative: 1 %
Eosinophils Absolute: 0.1 10*3/uL (ref 0.0–0.5)
Eosinophils Relative: 2 %
HCT: 40.4 % (ref 36.0–46.0)
Hemoglobin: 12.7 g/dL (ref 12.0–15.0)
Immature Granulocytes: 0 %
Lymphocytes Relative: 28 %
Lymphs Abs: 2.1 10*3/uL (ref 0.7–4.0)
MCH: 30.5 pg (ref 26.0–34.0)
MCHC: 31.4 g/dL (ref 30.0–36.0)
MCV: 96.9 fL (ref 80.0–100.0)
Monocytes Absolute: 0.7 10*3/uL (ref 0.1–1.0)
Monocytes Relative: 10 %
Neutro Abs: 4.4 10*3/uL (ref 1.7–7.7)
Neutrophils Relative %: 59 %
Platelets: 215 10*3/uL (ref 150–400)
RBC: 4.17 MIL/uL (ref 3.87–5.11)
RDW: 12.7 % (ref 11.5–15.5)
WBC: 7.5 10*3/uL (ref 4.0–10.5)
nRBC: 0 % (ref 0.0–0.2)

## 2020-10-05 LAB — COMPREHENSIVE METABOLIC PANEL
ALT: 13 U/L (ref 0–44)
AST: 21 U/L (ref 15–41)
Albumin: 4.3 g/dL (ref 3.5–5.0)
Alkaline Phosphatase: 44 U/L (ref 38–126)
Anion gap: 11 (ref 5–15)
BUN: 16 mg/dL (ref 8–23)
CO2: 27 mmol/L (ref 22–32)
Calcium: 9.1 mg/dL (ref 8.9–10.3)
Chloride: 101 mmol/L (ref 98–111)
Creatinine, Ser: 0.75 mg/dL (ref 0.44–1.00)
GFR, Estimated: >60 mL/min (ref >60)
Glucose, Bld: 80 mg/dL (ref 70–99)
Potassium: 4 mmol/L (ref 3.5–5.1)
Sodium: 139 mmol/L (ref 135–145)
Total Bilirubin: 1 mg/dL (ref 0.3–1.2)
Total Protein: 6.8 g/dL (ref 6.5–8.1)

## 2020-10-05 NOTE — ED Notes (Signed)
Pt ambulatory to toilet with steady gait noted.  

## 2020-10-05 NOTE — ED Notes (Signed)
EDP at bedside  

## 2020-10-05 NOTE — ED Notes (Signed)
Pt to CT
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
## 2020-10-12 ENCOUNTER — Ambulatory Visit
Admit: 2020-10-12 | Discharge: 2020-10-12 | Disposition: A | Discharge status: Home / Self Care | Payer: Medicare Other | Attending: Family Medicine | Admitting: Family Medicine

## 2020-10-12 ENCOUNTER — Ambulatory Visit
Admission: EM | Admit: 2020-10-12 | Discharge: 2020-10-12 | Disposition: A | Discharge status: Home / Self Care | Payer: Medicare Other | Attending: Family Medicine | Admitting: Family Medicine

## 2020-10-12 ENCOUNTER — Encounter: Payer: Self-pay | Admitting: Emergency Medicine

## 2020-10-12 ENCOUNTER — Other Ambulatory Visit: Payer: Self-pay

## 2020-10-12 DIAGNOSIS — R2231 Localized swelling, mass and lump, right upper limb: Secondary | ICD-10-CM | POA: Diagnosis not present
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
## 2020-10-12 NOTE — Discharge Instructions (Addendum)
No evidence of clot.  Ice and heat.  This will slowly resolve.  Take care  Dr. Adriana Simas

## 2021-06-20 ENCOUNTER — Encounter: Payer: Self-pay | Admitting: Intensive Care

## 2021-06-20 ENCOUNTER — Other Ambulatory Visit: Payer: Self-pay

## 2021-06-20 ENCOUNTER — Emergency Department
Admission: EM | Admit: 2021-06-20 | Discharge: 2021-06-20 | Disposition: A | Discharge status: Home / Self Care | Payer: Medicare Other | Attending: Emergency Medicine | Admitting: Emergency Medicine

## 2021-06-20 DIAGNOSIS — J45909 Unspecified asthma, uncomplicated: Secondary | ICD-10-CM | POA: Insufficient documentation

## 2021-06-20 DIAGNOSIS — I509 Heart failure, unspecified: Secondary | ICD-10-CM | POA: Diagnosis not present

## 2021-06-20 DIAGNOSIS — Z87891 Personal history of nicotine dependence: Secondary | ICD-10-CM | POA: Diagnosis not present

## 2021-06-20 DIAGNOSIS — M71341 Other bursal cyst, right hand: Secondary | ICD-10-CM | POA: Insufficient documentation

## 2021-06-20 DIAGNOSIS — M6740 Ganglion, unspecified site: Secondary | ICD-10-CM

## 2021-06-20 HISTORY — DX: Anxiety disorder, unspecified: F41.9

## 2021-06-20 NOTE — ED Triage Notes (Signed)
Patient presents with cyst on right thumb that she would like removed.
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
## 2021-06-20 NOTE — Discharge Instructions (Addendum)
You have arthritic changes at your right thumb interphalangeal joint with a mucoid cyst present.  Please call orthopedic office to schedule follow-up appointment.  Return to the ER for any fevers, swelling, severe pain, worsening symptoms or urgent changes in health.

## 2021-07-26 DIAGNOSIS — M67441 Ganglion, right hand: Secondary | ICD-10-CM | POA: Insufficient documentation

## 2021-07-26 DIAGNOSIS — M720 Palmar fascial fibromatosis [Dupuytren]: Secondary | ICD-10-CM | POA: Insufficient documentation
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
## 2022-01-08 ENCOUNTER — Ambulatory Visit
Admission: EM | Admit: 2022-01-08 | Discharge: 2022-01-08 | Disposition: A | Discharge status: Home / Self Care | Payer: Medicare HMO

## 2022-01-08 ENCOUNTER — Other Ambulatory Visit: Payer: Self-pay

## 2022-01-08 DIAGNOSIS — R58 Hemorrhage, not elsewhere classified: Secondary | ICD-10-CM

## 2022-01-08 NOTE — ED Provider Notes (Signed)
[facility]    CSN: [id number] Arrival date & time: 01/08/22  1718      History   Chief Complaint Chief Complaint  Patient presents with   Rash    HPI DELONA CLASBY is a 71 y.o. female presenting with "rash" on forearms x3 days. History varicose veins and vein stripping. States bruising began on bilateral forearms following taking baby aspirin x2 days for joint pain and then doing housework. Denies acute trauma. Denies pain, itching. Has attempted OTC hydrocortisone without relief. Has followed with PCP for similar issue in the past.   HPI  Past Medical History:  Diagnosis Date   Anxiety    Arthritis    Asthma    Barrett esophagus    CHF (congestive heart failure) (HCC)    GERD (gastroesophageal reflux disease)    History of vein stripping    Hypogammaglobulinemia (HCC)    Phlebitis    Thyroid disease     Patient Active Problem List   Diagnosis Date Noted   Digital mucinous cyst of finger of right hand 07/26/2021   Dupuytren's contracture of right hand 07/26/2021   Varicose veins of both lower extremities with complications 03/24/2020   Cardiomyopathy (HCC) 12/08/2019   Left thyroid nodule 12/08/2019   Osteopenia of multiple sites 07/08/2019   Vitamin D deficiency 07/08/2019   Nonrheumatic mitral valve regurgitation 05/26/2019   Pedal edema 05/20/2019   Enlarged lymph nodes in armpit 12/15/2018   Living will on file at physician's office 05/14/2018   Vitamin B12 deficiency 11/13/2016   Varicose veins of leg with pain, right 10/23/2016   Anxiety 04/28/2014   Gastroesophageal reflux disease with esophagitis 04/28/2014   HSV infection 04/28/2014   Mild intermittent asthma without complication 04/28/2014   Perennial allergic rhinitis with seasonal variation 04/28/2014    Past Surgical History:  Procedure Laterality Date   ANKLE SURGERY     BREAST BIOPSY Right    Lymph node removed   BREAST BIOPSY Left 2006   In [place]   BREAST BIOPSY Left 2006    In [place]   HEMORRHOID SURGERY     TONSILLECTOMY      OB History   No obstetric history on file.      Home Medications    Prior to Admission medications   Medication Sig Start Date End Date Taking? Authorizing Provider  albuterol (PROVENTIL HFA;VENTOLIN HFA) 108 (90 Base) MCG/ACT inhaler Inhale 2 puffs into the lungs every 4 (four) hours as needed for wheezing or shortness of breath.   Yes [provider]  Calcium Carb-Cholecalciferol (CALTRATE 600+D3 SOFT) 600-20 MG-MCG CHEW Chew by mouth.   Yes [provider]  Cholecalciferol 25 MCG (1000 UT) capsule Take by mouth.   Yes [provider]  cyanocobalamin 1000 MCG tablet Take by mouth.   Yes [provider]  fexofenadine (ALLEGRA) 60 MG tablet Take by mouth.   Yes [provider]  fluticasone (FLONASE) 50 MCG/ACT nasal spray Place 2 sprays into both nostrils daily.   Yes [provider]  levothyroxine (SYNTHROID) 88 MCG tablet Take 88 mcg by mouth daily. 12/22/21  Yes [provider]  montelukast (SINGULAIR) 10 MG tablet Take 1 tablet by mouth daily. 05/05/15  Yes [provider]  Multiple Vitamins-Minerals (ONE-A-DAY WOMENS PO) Take 2 tablets by mouth daily.   Yes [provider]  NEOMYCIN-POLYMYXIN-HYDROCORTISONE (CORTISPORIN) 1 % SOLN OTIC solution INSTILL 3 TO 4 DROPS INTO LEFT EAR THREE TIMES DAILY AS NEEDED FOR ITCHING 11/01/21  Yes [provider]  pantoprazole (PROTONIX) 20 MG tablet Take by mouth. 12/21/21  Yes [provider]  Probiotic Product (PROBIOTIC PO) Take 1 capsule by mouth daily.   Yes [provider]  pyridoxine (B-6) 500 MG tablet Take by mouth.   Yes [provider]  acetaminophen (TYLENOL) 500 MG tablet Take by mouth.    [provider]  CANNABIDIOL PO Take by mouth.    [provider]  cyclobenzaprine (FLEXERIL) 5 MG tablet Take 5 mg by mouth 3 (three) times daily as needed for  muscle spasms.    [provider]  diphenhydrAMINE (SOMINEX) 25 MG tablet Take by mouth.    [provider]  EPINEPHrine 0.3 mg/0.3 mL IJ SOAJ injection Inject 0.3 mg into the muscle once.    [provider]  ibuprofen (ADVIL) 200 MG tablet Take by mouth.    [provider]  metoprolol tartrate (LOPRESSOR) 25 MG tablet Take 12.5 mg by mouth daily. 12/22/21   [provider]  Misc Natural Products (T-RELIEF CBD+13) SUBL Place under the tongue.    [provider]  mometasone (ELOCON) 0.1 % cream Apply topically 2 (two) times daily. 09/05/21   [provider]  triamcinolone (NASACORT) 55 MCG/ACT AERO nasal inhaler Place into the nose.    [provider]  valACYclovir (VALTREX) 500 MG tablet Take 500 mg by mouth 2 (two) times daily.    [provider]    Family History Family History  Problem Relation Age of Onset   Breast cancer Paternal Aunt 63   Deep vein thrombosis Mother    Cancer Mother     Social History Social History   Tobacco Use   Smoking status: Former   Smokeless tobacco: Never  Building services engineer Use: Never used  Substance Use Topics   Alcohol use: Yes    Alcohol/week: 1.0 standard drink    Types: 1 Glasses of wine per week    Comment: Occ.   Drug use: No     Allergies   Bee venom, Clindamycin hcl, Clindamycin/lincomycin, Erythromycin, Other, Penicillins, Sodium tetradecyl sulfate, Sulfa antibiotics, Cat hair extract, Red dye, and Soap & cleansers [albolene]   Review of Systems Review of Systems  Skin:  Positive for color change.  All other systems reviewed and are negative.   Physical Exam Triage Vital Signs ED Triage Vitals  Enc Vitals Group     BP 01/08/22 1742 126/85     Pulse Rate 01/08/22 1742 74     Resp 01/08/22 1742 18     Temp 01/08/22 1742 98.1 F (36.7 C)     Temp Source 01/08/22 1742 Oral     SpO2 01/08/22 1742 100 %     Weight 01/08/22 1735 126 lb (57.2 kg)      Height 01/08/22 1735 5' 4.75" (1.645 m)     Head Circumference --      Peak Flow --      Pain Score 01/08/22 1735 0     Pain Loc --      Pain Edu? --      Excl. in [place]? --    No data found.  Updated Vital Signs BP 126/85 (BP Location: Left Arm)    Pulse 74    Temp 98.1 F (36.7 C) (Oral)    Resp 18    Ht 5' 4.75" (1.645 m)    Wt 126 lb (57.2 kg)    SpO2 100%    BMI  21.13 kg/m   Visual Acuity Right Eye Distance:   Left Eye Distance:   Bilateral Distance:    Right Eye Near:   Left Eye Near:    Bilateral Near:     Physical Exam Vitals reviewed.  Constitutional:      General: She is not in acute distress.    Appearance: Normal appearance. She is not ill-appearing.  HENT:     Head: Normocephalic and atraumatic.  Pulmonary:     Effort: Pulmonary effort is normal.  Skin:    Comments: See image below Bilateral forearms with bruising to the dorsal aspect. Also with varicose veins. No point tenderness, bony tenderness, venous distension or tenderness. Cap refill <2 seconds bilaterally.  Neurological:     General: No focal deficit present.     Mental Status: She is alert and oriented to person, place, and time.  Psychiatric:        Mood and Affect: Mood normal.        Behavior: Behavior normal.        Thought Content: Thought content normal.        Judgment: Judgment normal.      UC Treatments / Results  Labs (all labs ordered are listed, but only abnormal results are displayed) Labs Reviewed - No data to display  EKG   Radiology No results found.  Procedures Procedures (including critical care time)  Medications Ordered in UC Medications - No data to display  Initial Impression / Assessment and Plan / UC Course  I have reviewed the triage vital signs and the nursing notes.  Pertinent labs & imaging results that were available during my care of the patient were reviewed by me and considered in my medical decision making (see chart for details).      This patient is a very pleasant 71 y.o. year old female presenting with mild bruising of her forearms following taking a baby aspirin two days in a row for muscular pain. Reassuring exam, neurovascularly intact. Stop baby aspirin and only use tylenol for pain. F/u with PCP for additional concerns.  Final Clinical Impressions(s) / UC Diagnoses   Final diagnoses:  Ecchymosis     Discharge Instructions      Declines AVS     ED Prescriptions   None    PDMP not reviewed this encounter.   Rhys Martini, PA-C 01/08/22 1845

## 2022-01-08 NOTE — Discharge Instructions (Addendum)
Declines AVS 

## 2022-01-08 NOTE — ED Triage Notes (Signed)
Patient is here for "Rash" on forearms. Noticed in the last few days. Not itchy. No other concerns.  ?

## 2022-01-08 NOTE — ED Triage Notes (Signed)
Also, check right ear please. ?

## 2022-01-18 ENCOUNTER — Other Ambulatory Visit: Payer: Self-pay | Admitting: Internal Medicine

## 2022-02-18 ENCOUNTER — Ambulatory Visit
Admission: EM | Admit: 2022-02-18 | Discharge: 2022-02-18 | Disposition: A | Discharge status: Home / Self Care | Payer: Medicare HMO | Attending: Emergency Medicine | Admitting: Emergency Medicine

## 2022-02-18 ENCOUNTER — Encounter: Payer: Self-pay | Admitting: Emergency Medicine

## 2022-02-18 ENCOUNTER — Other Ambulatory Visit: Payer: Self-pay

## 2022-02-18 DIAGNOSIS — M545 Low back pain, unspecified: Secondary | ICD-10-CM | POA: Diagnosis not present

## 2022-02-18 MED ORDER — PREDNISONE 20 MG PO TABS: 40.0000 mg | ORAL_TABLET | Freq: Every day | ORAL | 0 refills | Status: AC

## 2022-02-18 MED ORDER — METHYLPREDNISOLONE SODIUM SUCC 40 MG IJ SOLR
60.0000 mg | Freq: Once | INTRAMUSCULAR | Status: AC
  Administered 2022-02-18: 60 mg via INTRAMUSCULAR

## 2022-02-18 NOTE — ED Provider Notes (Signed)
?[facility] ? ? ? ?CSN: [id number] ?Arrival date & time: 02/18/22  1229 ? ? ?  ? ?History   ?Chief Complaint ?Chief Complaint  ?Patient presents with  ? Back Pain  ? ? ?HPI ?Lydia Camacho is a 71 y.o. female.  ? ?Patient presents with intermittent low back pain occurring for 2 days.  Symptoms are a result of increased dog walking, endorses dog has been pulling on the leash. pain is worsened when changing positions and lying.  Pain is made better when in a squatting position and when pressure is applied to the bilateral hips.  Pain does not radiate.  Denies numbness or tingling, urinary or bowel incontinence.  Has attempted use of CBD oil, Tylenol, muscle relaxers which have been effective up until this morning.  History of lumbar disc changes. ? ? ?Past Medical History:  ?Diagnosis Date  ? Anxiety   ? Arthritis   ? Asthma   ? Barrett esophagus   ? CHF (congestive heart failure) ([place])   ? GERD (gastroesophageal reflux disease)   ? History of vein stripping   ? Hypogammaglobulinemia ([place])   ? Phlebitis   ? Thyroid disease   ? ? ?Patient Active Problem List  ? Diagnosis Date Noted  ? Digital mucinous cyst of finger of right hand 07/26/2021  ? Dupuytren's contracture of right hand 07/26/2021  ? Varicose veins of both lower extremities with complications [id number]  ? Cardiomyopathy ([place]) 12/08/2019  ? Left thyroid nodule 12/08/2019  ? Osteopenia of multiple sites 07/08/2019  ? Vitamin D deficiency 07/08/2019  ? Nonrheumatic mitral valve regurgitation 05/26/2019  ? Pedal edema 05/20/2019  ? Enlarged lymph nodes in armpit 12/15/2018  ? Living will on file at physician's office 05/14/2018  ? Vitamin B12 deficiency 11/13/2016  ? Varicose veins of leg with pain, right 10/23/2016  ? Anxiety 04/28/2014  ? Gastroesophageal reflux disease with esophagitis 04/28/2014  ? HSV infection 04/28/2014  ? Mild intermittent asthma without complication [id number]  ? Perennial allergic rhinitis with seasonal variation 04/28/2014   ? ? ?Past Surgical History:  ?Procedure Laterality Date  ? ANKLE SURGERY    ? BREAST BIOPSY Right   ? Lymph node removed  ? BREAST BIOPSY Left 2006  ? In [place]  ? BREAST BIOPSY Left 2006  ? In [place]  ? HEMORRHOID SURGERY    ? TONSILLECTOMY    ? ? ?OB History   ?No obstetric history on file. ?  ? ? ? ?Home Medications   ? ?Prior to Admission medications   ?Medication Sig Start Date End Date Taking? Authorizing Provider  ?acetaminophen (TYLENOL) 500 MG tablet Take by mouth.    [provider]  ?albuterol (PROVENTIL HFA;VENTOLIN HFA) 108 (90 Base) MCG/ACT inhaler Inhale 2 puffs into the lungs every 4 (four) hours as needed for wheezing or shortness of breath.    [provider]  ?Calcium Carb-Cholecalciferol (CALTRATE 600+D3 SOFT) 600-20 MG-MCG CHEW Chew by mouth.    [provider]  ?CANNABIDIOL PO Take by mouth.    [provider]  ?Cholecalciferol 25 MCG (1000 UT) capsule Take by mouth.    [provider]  ?cyanocobalamin 1000 MCG tablet Take by mouth.    [provider]  ?cyclobenzaprine (FLEXERIL) 5 MG tablet Take 5 mg by mouth 3 (three) times daily as needed for muscle spasms.    [provider]  ?diphenhydrAMINE (SOMINEX) 25 MG tablet Take by mouth.    [provider]  ?EPINEPHrine 0.3 mg/0.3 mL  IJ SOAJ injection Inject 0.3 mg into the muscle once.    [provider]  ?fexofenadine (ALLEGRA) 60 MG tablet Take by mouth.    [provider]  ?fluticasone (FLONASE) 50 MCG/ACT nasal spray Place 2 sprays into both nostrils daily.    [provider]  ?ibuprofen (ADVIL) 200 MG tablet Take by mouth.    [provider]  ?levothyroxine (SYNTHROID) 88 MCG tablet Take 88 mcg by mouth daily. 12/22/21   [provider]  ?metoprolol tartrate (LOPRESSOR) 25 MG tablet Take 12.5 mg by mouth daily. 12/22/21   [provider]  ?Misc Natural Products (T-RELIEF CBD+13) SUBL Place under the tongue.     [provider]  ?mometasone (ELOCON) 0.1 % cream Apply topically 2 (two) times daily. 09/05/21   [provider]  ?montelukast (SINGULAIR) 10 MG tablet Take 1 tablet by mouth daily. 05/05/15   [provider]  ?Multiple Vitamins-Minerals (ONE-A-DAY WOMENS PO) Take 2 tablets by mouth daily.    [provider]  ?NEOMYCIN-POLYMYXIN-HYDROCORTISONE (CORTISPORIN) 1 % SOLN OTIC solution INSTILL 3 TO 4 DROPS INTO LEFT EAR THREE TIMES DAILY AS NEEDED FOR ITCHING 11/01/21   [provider]  ?pantoprazole (PROTONIX) 20 MG tablet Take by mouth. 12/21/21   [provider]  ?Probiotic Product (PROBIOTIC PO) Take 1 capsule by mouth daily.    [provider]  ?pyridoxine (B-6) 500 MG tablet Take by mouth.    [provider]  ?triamcinolone (NASACORT) 55 MCG/ACT AERO nasal inhaler Place into the nose.    [provider]  ?valACYclovir (VALTREX) 500 MG tablet Take 500 mg by mouth 2 (two) times daily.    [provider]  ? ? ?Family History ?Family History  ?Problem Relation Age of Onset  ? Breast cancer Paternal Aunt 45  ? Deep vein thrombosis Mother   ? Cancer Mother   ? ? ?Social History ?Social History  ? ?Tobacco Use  ? Smoking status: Former  ? Smokeless tobacco: Never  ?Vaping Use  ? Vaping Use: Never used  ?Substance Use Topics  ? Alcohol use: Yes  ?  Alcohol/week: 1.0 standard drink  ?  Types: 1 Glasses of wine per week  ?  Comment: Occ.  ? Drug use: No  ? ? ? ?Allergies   ?Bee venom, Clindamycin hcl, Clindamycin/lincomycin, Erythromycin, Other, Penicillins, Sodium tetradecyl sulfate, Sulfa antibiotics, Cat hair extract, Red dye, and Soap & cleansers [albolene] ? ? ?Review of Systems ?Review of Systems  ?Constitutional: Negative.   ?Respiratory: Negative.    ?Cardiovascular: Negative.   ?Musculoskeletal:  Positive for back pain. Negative for arthralgias, gait problem, joint swelling, myalgias, neck pain and neck stiffness.  ?Skin:  Negative.   ? ? ?Physical Exam ?Triage Vital Signs ?ED Triage Vitals  ?Enc Vitals Group  ?   BP 02/18/22 1242 106/68  ?   Pulse Rate 02/18/22 1242 86  ?   Resp 02/18/22 1242 14  ?   Temp 02/18/22 1242 97.7 ?F (36.5 ?C)  ?   Temp Source 02/18/22 1242 Oral  ?   SpO2 02/18/22 1242 96 %  ?   Weight 02/18/22 1240 126 lb (57.2 kg)  ?   Height 02/18/22 1240 5\' 4"  (1.626 m)  ?   Head Circumference --   ?   Peak Flow --   ?   Pain Score 02/18/22 1240 8  ?   Pain Loc --   ?   Pain Edu? --   ?  Excl. in [place]? --   ? ?No data found. ? ?Updated Vital Signs ?BP 106/68 (BP Location: Left Arm)   Pulse 86   Temp 97.7 ?F (36.5 ?C) (Oral)   Resp 14   Ht 5\' 4"  (1.626 m)   Wt 126 lb (57.2 kg)   SpO2 96%   BMI 21.63 kg/m?  ? ?Visual Acuity ?Right Eye Distance:   ?Left Eye Distance:   ?Bilateral Distance:   ? ?Right Eye Near:   ?Left Eye Near:    ?Bilateral Near:    ? ?Physical Exam ?Constitutional:   ?   Appearance: Normal appearance.  ?HENT:  ?   Head: Normocephalic.  ?Eyes:  ?   Extraocular Movements: Extraocular movements intact.  ?Pulmonary:  ?   Effort: Pulmonary effort is normal.  ?Musculoskeletal:  ?   Comments: Tenderness over the midline in the lumbar region, no swelling, deformity or ecchymosis noted  ?Neurological:  ?   Mental Status: She is alert and oriented to person, place, and time. Mental status is at baseline.  ?Psychiatric:     ?   Mood and Affect: Mood normal.     ?   Behavior: Behavior normal.  ? ? ? ?UC Treatments / Results  ?Labs ?(all labs ordered are listed, but only abnormal results are displayed) ?Labs Reviewed - No data to display ? ?EKG ? ? ?Radiology ?No results found. ? ?Procedures ?Procedures (including critical care time) ? ?Medications Ordered in UC ?Medications - No data to display ? ?Initial Impression / Assessment and Plan / UC Course  ?I have reviewed the triage vital signs and the nursing notes. ? ?Pertinent labs & imaging results that were available during my care of the patient were reviewed  by me and considered in my medical decision making (see chart for details). ? ?Acute midline low back pain without sciatica ? ?Etiology is symptoms could be related to a flare in the compression of the lumbar disc, dis

## 2022-02-18 NOTE — ED Triage Notes (Signed)
Patient reports lower back pain that started 2 days ago.  Patient states that she has a history of disc problems.   ?

## 2022-02-18 NOTE — Discharge Instructions (Signed)
Your pain is most likely caused by a flare to the disc in your lumbar region ? ?Starting tomorrow take prednisone every morning with food for the next 5 days, while using this medicine you may continue use of Tylenol and muscle relaxers ? ?You may use heating pad in 15 minute intervals as needed for additional comfort, within the first 2-3 days you may find comfort in using ice in 10-15 minutes over affected area ? ?Begin stretching affected area daily for 10 minutes as tolerated to further loosen muscles  ? ?When lying down place pillow underneath and between knees for support ? ?Can try sleeping without pillow on firm mattress  ? ?Practice good posture: head back, shoulders back, chest forward, pelvis back and weight distributed evenly on both legs ? ?If pain persist after recommended treatment or reoccurs if may be beneficial to follow up with orthopedic specialist for evaluation, this doctor specializes in the bones and can manage your symptoms long-term with options such as but not limited to imaging, medications or physical therapy  ?  ?

## 2022-06-05 ENCOUNTER — Encounter: Payer: Self-pay | Admitting: Emergency Medicine

## 2022-06-05 ENCOUNTER — Ambulatory Visit
Admission: EM | Admit: 2022-06-05 | Discharge: 2022-06-05 | Disposition: A | Discharge status: Home / Self Care | Payer: Medicare HMO | Attending: Family Medicine | Admitting: Family Medicine

## 2022-06-05 DIAGNOSIS — L989 Disorder of the skin and subcutaneous tissue, unspecified: Secondary | ICD-10-CM

## 2022-06-05 NOTE — Discharge Instructions (Signed)
Follow up with dermatology as scheduled.  Be sure to wear a hat and sunscreen when gardening or outside in the sun for >30 minutes.

## 2022-06-05 NOTE — ED Triage Notes (Signed)
Pt presents with a bump on her scalp x 3 months.

## 2022-06-05 NOTE — ED Provider Notes (Signed)
[facility]    CSN: [id number] Arrival date & time: 06/05/22  1026      History   Chief Complaint Chief Complaint  Patient presents with   Cyst    HPI Lydia Camacho is a 71 y.o. female.   HPI  Patient here for possible cyst on the top of her head that has been present for several months now.  She states 3 months ago she was in the garden and noticed the area.  She states that it has gotten a little bigger over time.  About a month ago she was bitten by a bug possibly in the garden.  States that the area itches at times.  She tried topical steroid but this did not help.  She has a dull pain on the right side of her head.  States that she did hit her head about 2 to 3 months ago while she was getting in the car.  She started googling things and decided to be seen in the urgent care today.  She has intermittent headaches.  No discharge or bleeding from the site.    Past Medical History:  Diagnosis Date   Anxiety    Arthritis    Asthma    Barrett esophagus    CHF (congestive heart failure) (HCC)    GERD (gastroesophageal reflux disease)    History of vein stripping    Hypogammaglobulinemia (HCC)    Phlebitis    Thyroid disease     Patient Active Problem List   Diagnosis Date Noted   Digital mucinous cyst of finger of right hand 07/26/2021   Dupuytren's contracture of right hand 07/26/2021   Varicose veins of both lower extremities with complications 03/24/2020   Cardiomyopathy (HCC) 12/08/2019   Left thyroid nodule 12/08/2019   Osteopenia of multiple sites 07/08/2019   Vitamin D deficiency 07/08/2019   Nonrheumatic mitral valve regurgitation 05/26/2019   Pedal edema 05/20/2019   Enlarged lymph nodes in armpit 12/15/2018   Living will on file at physician's office 05/14/2018   Vitamin B12 deficiency 11/13/2016   Varicose veins of leg with pain, right 10/23/2016   Anxiety 04/28/2014   Gastroesophageal reflux disease with esophagitis 04/28/2014   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      Home Medications    Prior to Admission medications   Medication Sig Start Date End Date Taking? Authorizing Provider  acetaminophen (TYLENOL) 500 MG tablet Take by mouth.    [provider]  albuterol (PROVENTIL HFA;VENTOLIN HFA) 108 (90 Base) MCG/ACT inhaler Inhale 2 puffs into the lungs every 4 (four) hours as needed for wheezing or shortness of breath.    [provider]  Calcium Carb-Cholecalciferol (CALTRATE 600+D3 SOFT) 600-20 MG-MCG CHEW Chew by mouth.    [provider]  CANNABIDIOL PO Take by mouth.    [provider]  Cholecalciferol 25 MCG (1000 UT) capsule Take by mouth.    [provider]  cyanocobalamin 1000 MCG tablet Take by mouth.  [provider]  cyclobenzaprine (FLEXERIL) 5 MG tablet Take 5 mg by mouth 3 (three) times daily as needed for muscle spasms.    [provider]  diphenhydrAMINE (SOMINEX) 25 MG tablet Take by mouth.    [provider]  EPINEPHrine 0.3 mg/0.3 mL IJ SOAJ injection Inject 0.3 mg into the muscle once.    [provider]  fexofenadine (ALLEGRA) 60 MG tablet Take by mouth.    [provider]  fluticasone (FLONASE) 50 MCG/ACT nasal spray Place 2 sprays into both nostrils daily.    [provider]  ibuprofen (ADVIL) 200 MG tablet Take by mouth.    [provider]  levothyroxine (SYNTHROID) 88 MCG tablet Take 88 mcg by mouth daily. 12/22/21   [provider]  metoprolol tartrate (LOPRESSOR) 25 MG  tablet Take 12.5 mg by mouth daily. 12/22/21   [provider]  Misc Natural Products (T-RELIEF CBD+13) SUBL Place under the tongue.    [provider]  mometasone (ELOCON) 0.1 % cream Apply topically 2 (two) times daily. 09/05/21   [provider]  montelukast (SINGULAIR) 10 MG tablet Take 1 tablet by mouth daily. 05/05/15   [provider]  Multiple Vitamins-Minerals (ONE-A-DAY WOMENS PO) Take 2 tablets by mouth daily.    [provider]  NEOMYCIN-POLYMYXIN-HYDROCORTISONE (CORTISPORIN) 1 % SOLN OTIC solution INSTILL 3 TO 4 DROPS INTO LEFT EAR THREE TIMES DAILY AS NEEDED FOR ITCHING 11/01/21   [provider]  pantoprazole (PROTONIX) 20 MG tablet Take by mouth. 12/21/21   [provider]  predniSONE (DELTASONE) 20 MG tablet Take 2 tablets (40 mg total) by mouth daily. 02/18/22   Valinda Hoar, NP  Probiotic Product (PROBIOTIC PO) Take 1 capsule by mouth daily.    [provider]  pyridoxine (B-6) 500 MG tablet Take by mouth.    [provider]  triamcinolone (NASACORT) 55 MCG/ACT AERO nasal inhaler Place into the nose.    [provider]  valACYclovir (VALTREX) 500 MG tablet Take 500 mg by mouth 2 (two) times daily.    [provider]    Family History Family History  Problem Relation Age of Onset   Breast cancer Paternal Aunt 65   Deep vein thrombosis Mother    Cancer Mother     Social History Social History   Tobacco Use   Smoking status: Former   Smokeless tobacco: Never  Building services engineer Use: Never used  Substance Use Topics   Alcohol use: Yes    Alcohol/week: 1.0 standard drink of alcohol    Types: 1 Glasses of wine per week    Comment: Occ.   Drug use: No     Allergies   Bee venom, Clindamycin hcl, Clindamycin/lincomycin, Erythromycin, Other, Penicillins, Sodium tetradecyl sulfate, Sulfa antibiotics, Cat hair extract, Red dye, and Soap & cleansers [albolene]   Review  of Systems Review of Systems see HPI   Physical Exam Triage Vital Signs ED Triage Vitals  Enc Vitals Group     BP 06/05/22 1059 127/80     Pulse Rate 06/05/22 1057 73     Resp 06/05/22 1057 16     Temp 06/05/22 1057 98.2 F (36.8 C)     Temp Source 06/05/22 1057 Oral     SpO2 06/05/22 1057 99 %     Weight --      Height --      Head Circumference --      Peak Flow --  Pain Score 06/05/22 1056 0     Pain Loc --      Pain Edu? --      Excl. in GC? --    No data found.  Updated Vital Signs BP 127/80 (BP Location: Left Arm)   Pulse 73   Temp 98.2 F (36.8 C) (Oral)   Resp 16   SpO2 99%   Visual Acuity Right Eye Distance:   Left Eye Distance:   Bilateral Distance:    Right Eye Near:   Left Eye Near:    Bilateral Near:     Physical Exam GEN: well appearing elderly female in no acute distress  CVS: well perfused  RESP: speaking in full sentences without pause, no respiratory distress  SKIN: Irregularly-shaped pearly raised lesion with telangiectasias concerning for basal cell lesion (see image below)    UC Treatments / Results  Labs (all labs ordered are listed, but only abnormal results are displayed) Labs Reviewed - No data to display  EKG   Radiology No results found.  Procedures Procedures (including critical care time)  Medications Ordered in UC Medications - No data to display  Initial Impression / Assessment and Plan / UC Course  I have reviewed the triage vital signs and the nursing notes.  Pertinent labs & imaging results that were available during my care of the patient were reviewed by me and considered in my medical decision making (see chart for details).      Scalp lesion: Patient has a dermatology group appointment with Dr. Dwana Melena at [facility] dermatology in 2 weeks.  Advised patient to keep appointment.  Lesion concerning for basal cell carcinoma.  She does not wear a hat when she is gardening or use sunscreen.   Advised patient to do so.    Final Clinical Impressions(s) / UC Diagnoses   Final diagnoses:  Scalp lesion     Discharge Instructions      Follow up with dermatology as scheduled.  Be sure to wear a hat and sunscreen when gardening or outside in the sun for >30 minutes.      ED Prescriptions   None    PDMP not reviewed this encounter.   Katha Cabal, DO 06/05/22 1137

## 2023-04-03 ENCOUNTER — Encounter: Payer: Self-pay | Admitting: Ophthalmology

## 2023-04-04 NOTE — Anesthesia Preprocedure Evaluation (Addendum)
Anesthesia Evaluation  Patient identified by MRN, date of birth, ID band Patient awake    Reviewed: Allergy & Precautions, H&P , NPO status , Patient's Chart, lab work & pertinent test results, reviewed documented beta blocker date and time   Airway Mallampati: II  TM Distance: <3 FB Neck ROM: Full    Dental no notable dental hx. (+) Caps Caps upper teeth:   Pulmonary asthma , COPD,  COPD inhaler, former smoker   Pulmonary exam normal breath sounds clear to auscultation       Cardiovascular +CHF  Normal cardiovascular exam Rhythm:Regular Rate:Normal  Ectopic atrial tachycardia Palpitations NSVT Cardiomyopathy EF 58% Mild AR, mild to moderate TR  Imaging Results - Cardiac MRI heart stress morphology and function with and without contrast (08/06/2022 9:05 AM EDT) Narrative 08/06/2022 11:18 AM EDT                                                     [facility]                                                  CMR Report   MRN:                 [id number]                                Name:        Lydia Camacho, Lydia Camacho                                DOB:            June 19, 1951                                Scan Date:      2023-Oct-02                                Electronically signed by Dr Tish Frederickson, M.D. 2023-Oct-02 11:18:01  SUMMARY ==========================================================================================================  Regadenoson cardiac MRI with and without contrast was performed on a 3.0 T scanner to evaluate myocardial morphology, function, viability, and assess for inducible myocardial ischemia in a patient  with NSVT.  1. The left ventricle is normal in cavity size and overall wall thickness. There is hypertrophy and twisting of the basal lateral wall, which is seen in mitral valve prolapse. Global systolic function is normal. The LV ejection fraction is 58%. There are no regional wall motion abnormalities.  2.  The right ventricle is normal in cavity size, wall thickness, and systolic function. No regional akinetic or dyskinetic areas seen.  3. Both atria are mildly enlarged.  4. The aortic valve is trileaflet in morphology without evidence of stenosis, there is mild aortic regurgitation. There is mild-moderate tricuspid valve regurgitation, and trivial pulmonic insufficiency. 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  5. Delayed enhancement imaging demonstrates no evidence of myocardial infarction, scar or infiltrative disease.  6. Regadenoson perfusion imaging demonstrates stress perfusion defects suggestive of inducible myocardial ischemia in two territories:  a. moderate ischemia inferior (likely RCA territory)  b. mild ischemia distal septum (likely LAD territory)      Neuro/Psych   Anxiety     negative neurological ROS  negative psych ROS   GI/Hepatic Neg liver ROS,GERD  ,,Barretts esophagus   Endo/Other  negative endocrine ROS    Renal/GU negative Renal ROS  negative genitourinary   Musculoskeletal  (+) Arthritis ,    Abdominal   Peds negative pediatric ROS (+)  Hematology negative hematology ROS (+)   Anesthesia Other Findings Thyroid disease  Asthma GERD (gastroesophageal reflux disease) Barrett esophagus Arthritis  Hypogammaglobulinemia (HCC) Phlebitis  History of vein stripping CHF (congestive heart failure) (HCC) Anxiety Non-sustained V tach  Reproductive/Obstetrics negative OB ROS                              Anesthesia Physical Anesthesia Plan  ASA: 3  Anesthesia Plan: MAC   Post-op Pain Management:    Induction: Intravenous  PONV Risk Score and Plan:   Airway Management Planned: Natural Airway and Nasal Cannula  Additional Equipment:   Intra-op Plan:   Post-operative Plan:   Informed Consent: I have reviewed the patients History and Physical, chart,  labs and discussed the procedure including the risks, benefits and alternatives for the proposed anesthesia with the patient or authorized representative who has indicated his/her understanding and acceptance.     Dental Advisory Given  Plan Discussed with: Anesthesiologist, CRNA and Surgeon  Anesthesia Plan Comments: (Patient consented for risks of anesthesia including but not limited to:  - adverse reactions to medications - damage to eyes, teeth, lips or other oral mucosa - nerve damage due to positioning  - sore throat or hoarseness - Damage to heart, brain, nerves, lungs, other parts of body or loss of life  Patient voiced understanding.)         Anesthesia Quick Evaluation

## 2023-04-09 NOTE — Discharge Instructions (Signed)

## 2023-04-11 ENCOUNTER — Ambulatory Visit: Payer: Medicare HMO | Admitting: Anesthesiology

## 2023-04-11 ENCOUNTER — Encounter: Payer: Self-pay | Admitting: Ophthalmology

## 2023-04-11 ENCOUNTER — Other Ambulatory Visit: Payer: Self-pay

## 2023-04-11 ENCOUNTER — Ambulatory Visit
Admission: RE | Admit: 2023-04-11 | Discharge: 2023-04-11 | Disposition: A | Discharge status: Home / Self Care | Payer: Medicare HMO | Attending: Ophthalmology | Admitting: Ophthalmology

## 2023-04-11 ENCOUNTER — Encounter
Admission: RE | Disposition: A | Discharge status: Home / Self Care | Payer: Self-pay | Source: Home / Self Care | Attending: Ophthalmology

## 2023-04-11 DIAGNOSIS — I509 Heart failure, unspecified: Secondary | ICD-10-CM | POA: Diagnosis not present

## 2023-04-11 DIAGNOSIS — Z87891 Personal history of nicotine dependence: Secondary | ICD-10-CM | POA: Diagnosis not present

## 2023-04-11 DIAGNOSIS — H2511 Age-related nuclear cataract, right eye: Secondary | ICD-10-CM | POA: Insufficient documentation

## 2023-04-11 DIAGNOSIS — J449 Chronic obstructive pulmonary disease, unspecified: Secondary | ICD-10-CM | POA: Diagnosis not present

## 2023-04-11 DIAGNOSIS — Z09 Encounter for follow-up examination after completed treatment for conditions other than malignant neoplasm: Secondary | ICD-10-CM | POA: Insufficient documentation

## 2023-04-11 HISTORY — DX: Chronic obstructive pulmonary disease, unspecified: J44.9

## 2023-04-11 HISTORY — DX: Personal history of other endocrine, nutritional and metabolic disease: Z86.39

## 2023-04-11 HISTORY — DX: Other ventricular tachycardia: I47.29

## 2023-04-11 HISTORY — DX: Rheumatic tricuspid insufficiency: I07.1

## 2023-04-11 HISTORY — DX: Age-related osteoporosis without current pathological fracture: M81.0

## 2023-04-11 HISTORY — PX: CATARACT EXTRACTION W/PHACO: SHX586

## 2023-04-11 HISTORY — DX: Hypothyroidism, unspecified: E03.9

## 2023-04-11 HISTORY — DX: Nonrheumatic aortic (valve) insufficiency: I35.1

## 2023-04-11 SURGERY — PHACOEMULSIFICATION, CATARACT, WITH IOL INSERTION
Anesthesia: Monitor Anesthesia Care | Laterality: Right

## 2023-04-11 MED ORDER — SIGHTPATH DOSE#1 NA HYALUR & NA CHOND-NA HYALUR IO KIT
PACK | INTRAOCULAR | Status: DC | PRN
  Administered 2023-04-11: 1 via OPHTHALMIC

## 2023-04-11 MED ORDER — LIDOCAINE HCL (PF) 2 % IJ SOLN
INTRAOCULAR | Status: DC | PRN
  Administered 2023-04-11: 4 mL via INTRAOCULAR

## 2023-04-11 MED ORDER — MIDAZOLAM HCL 2 MG/2ML IJ SOLN
INTRAMUSCULAR | Status: DC | PRN
  Administered 2023-04-11: 1 mg via INTRAVENOUS

## 2023-04-11 MED ORDER — SIGHTPATH DOSE#1 BSS IO SOLN
INTRAOCULAR | Status: DC | PRN
  Administered 2023-04-11: 54 mL via OPHTHALMIC

## 2023-04-11 MED ORDER — BRIMONIDINE TARTRATE-TIMOLOL 0.2-0.5 % OP SOLN
OPHTHALMIC | Status: DC | PRN
  Administered 2023-04-11: 1 [drp] via OPHTHALMIC

## 2023-04-11 MED ORDER — SIGHTPATH DOSE#1 BSS IO SOLN
INTRAOCULAR | Status: DC | PRN
  Administered 2023-04-11: 15 mL via INTRAOCULAR

## 2023-04-11 MED ORDER — LACTATED RINGERS IV SOLN: INTRAVENOUS | Status: DC

## 2023-04-11 MED ORDER — FENTANYL CITRATE (PF) 100 MCG/2ML IJ SOLN
INTRAMUSCULAR | Status: DC | PRN
  Administered 2023-04-11: 50 ug via INTRAVENOUS

## 2023-04-11 MED ORDER — MOXIFLOXACIN HCL 0.5 % OP SOLN
OPHTHALMIC | Status: DC | PRN
  Administered 2023-04-11: .2 mL via OPHTHALMIC

## 2023-04-11 MED ORDER — TETRACAINE HCL 0.5 % OP SOLN
1.0000 [drp] | OPHTHALMIC | Status: DC | PRN
  Administered 2023-04-11 (×3): 1 [drp] via OPHTHALMIC

## 2023-04-11 MED ORDER — ARMC OPHTHALMIC DILATING DROPS
1.0000 | OPHTHALMIC | Status: DC | PRN
  Administered 2023-04-11 (×3): 1 via OPHTHALMIC

## 2023-04-11 SURGICAL SUPPLY — 10 items
CATARACT SUITE SIGHTPATH (MISCELLANEOUS) ×1 IMPLANT
DISSECTOR HYDRO NUCLEUS 50X22 (MISCELLANEOUS) ×1 IMPLANT
DRSG TEGADERM 2-3/8X2-3/4 SM (GAUZE/BANDAGES/DRESSINGS) ×1 IMPLANT
FEE CATARACT SUITE SIGHTPATH (MISCELLANEOUS) ×1 IMPLANT
GLOVE SURG SYN 7.5 E (GLOVE) ×1 IMPLANT
GLOVE SURG SYN 7.5 PF PI (GLOVE) ×1 IMPLANT
GLOVE SURG SYN 8.5 E (GLOVE) ×1 IMPLANT
GLOVE SURG SYN 8.5 PF PI (GLOVE) ×1 IMPLANT
LENS ENVISTA 22.5 (Intraocular Lens) ×1 IMPLANT
LENS IOL ENVISTA UV+ 22.5 (Intraocular Lens) IMPLANT

## 2023-04-11 NOTE — H&P (Signed)
[facility]   Primary Care Physician:  Enid Baas, MD Ophthalmologist: Dr. Deberah Pelton  Pre-Procedure History & Physical: HPI:  Lydia Camacho is a 72 y.o. female here for cataract surgery.   Past Medical History:  Diagnosis Date   Anxiety    Arthritis    Asthma    Barrett esophagus    CHF (congestive heart failure) (HCC)    COPD (chronic obstructive pulmonary disease) (HCC)    GERD (gastroesophageal reflux disease)    History of vein stripping    Hx of iron deficiency    Hypogammaglobulinemia (HCC)    Hypothyroidism    Mild aortic regurgitation    Moderate tricuspid regurgitation by prior echocardiogram    NSVT (nonsustained ventricular tachycardia) (HCC)    Osteoporosis    Phlebitis    Thyroid disease     Past Surgical History:  Procedure Laterality Date   ANKLE SURGERY     BREAST BIOPSY Right    Lymph node removed   BREAST BIOPSY Left 2006   In [place]   BREAST BIOPSY Left 2006   In [place]   HEMORRHOID SURGERY     TONSILLECTOMY      Prior to Admission medications   Medication Sig Start Date End Date Taking? Authorizing Provider  albuterol (PROVENTIL HFA;VENTOLIN HFA) 108 (90 Base) MCG/ACT inhaler Inhale 2 puffs into the lungs every 4 (four) hours as needed for wheezing or shortness of breath.   Yes [provider]  Calcium Carb-Cholecalciferol (CALTRATE 600+D3 SOFT) 600-20 MG-MCG CHEW Chew by mouth.   Yes [provider]  Cholecalciferol 25 MCG (1000 UT) capsule Take by mouth.   Yes [provider]  cyanocobalamin 1000 MCG tablet Take by mouth.   Yes [provider]  cyclobenzaprine (FLEXERIL) 5 MG tablet Take 5 mg by mouth 3 (three) times daily as needed for muscle spasms.   Yes [provider]  EPINEPHrine 0.3 mg/0.3 mL IJ SOAJ injection Inject 0.3 mg into the muscle once.   Yes [provider]  fluticasone (FLONASE) 50 MCG/ACT nasal spray Place 2 sprays into both nostrils daily.   Yes  [provider]  levothyroxine (SYNTHROID) 88 MCG tablet Take 88 mcg by mouth daily. 12/22/21  Yes [provider]  metoprolol tartrate (LOPRESSOR) 25 MG tablet Take 12.5 mg by mouth daily. 12/22/21  Yes [provider]  mometasone (ELOCON) 0.1 % cream Apply topically 2 (two) times daily. 09/05/21  Yes [provider]  montelukast (SINGULAIR) 10 MG tablet Take 1 tablet by mouth daily. 05/05/15  Yes [provider]  Multiple Vitamins-Minerals (ONE-A-DAY WOMENS PO) Take 2 tablets by mouth daily.   Yes [provider]  pantoprazole (PROTONIX) 20 MG tablet Take by mouth. 12/21/21  Yes [provider]  pyridoxine (B-6) 500 MG tablet Take by mouth.   Yes [provider]  valACYclovir (VALTREX) 500 MG tablet Take 500 mg by mouth 2 (two) times daily.   Yes [provider]  acetaminophen (TYLENOL) 500 MG tablet Take by mouth. Patient not taking: Reported on 04/03/2023    [provider]  CANNABIDIOL PO Take by mouth. Patient not taking: Reported on 04/03/2023    [provider]  diphenhydrAMINE (SOMINEX) 25 MG tablet Take by mouth. Patient not taking: Reported on 04/03/2023    [provider]  fexofenadine (ALLEGRA) 60 MG tablet Take by mouth. Patient not taking: Reported on 04/03/2023    [provider]  ibuprofen (ADVIL) 200 MG tablet Take by  mouth. Patient not taking: Reported on 04/03/2023    [provider]  Misc Natural Products (T-RELIEF CBD+13) SUBL Place under the tongue. Patient not taking: Reported on 04/03/2023    [provider]  NEOMYCIN-POLYMYXIN-HYDROCORTISONE (CORTISPORIN) 1 % SOLN OTIC solution INSTILL 3 TO 4 DROPS INTO LEFT EAR THREE TIMES DAILY AS NEEDED FOR ITCHING Patient not taking: Reported on 04/03/2023 11/01/21   [provider]  predniSONE (DELTASONE) 20 MG tablet Take 2 tablets (40 mg total) by mouth daily. Patient not taking: Reported on  04/03/2023 02/18/22   Valinda Hoar, NP  Probiotic Product (PROBIOTIC PO) Take 1 capsule by mouth daily. Patient not taking: Reported on 04/03/2023    [provider]  triamcinolone (NASACORT) 55 MCG/ACT AERO nasal inhaler Place into the nose. Patient not taking: Reported on 04/03/2023    [provider]    Allergies as of 03/22/2023 - Review Complete 06/05/2022  Allergen Reaction Noted   Bee venom Anaphylaxis 12/29/2015   Clindamycin hcl Anaphylaxis 01/08/2022   Clindamycin/lincomycin Anaphylaxis 12/29/2015   Erythromycin Anaphylaxis 12/29/2015   Other Anaphylaxis and Other (See Comments) 11/05/1968   Penicillins Anaphylaxis 12/29/2015   Sodium tetradecyl sulfate Anaphylaxis 12/29/2015   Sulfa antibiotics Anaphylaxis 12/29/2015   Cat hair extract  11/05/2013   Red dye Itching 04/15/2020   Soap & cleansers [albolene] Itching and Rash 11/05/1958    Family History  Problem Relation Age of Onset   Breast cancer Paternal Aunt 73   Deep vein thrombosis Mother    Cancer Mother     Social History   Socioeconomic History   Marital status: Widowed    Spouse name: Not on file   Number of children: Not on file   Years of education: Not on file   Highest education level: Not on file  Occupational History   Not on file  Tobacco Use   Smoking status: Former   Smokeless tobacco: Never  Vaping Use   Vaping Use: Never used  Substance and Sexual Activity   Alcohol use: Yes    Alcohol/week: 1.0 standard drink of alcohol    Types: 1 Glasses of wine per week    Comment: Occ.   Drug use: No   Sexual activity: Not on file  Other Topics Concern   Not on file  Social History Narrative   Not on file   Social Determinants of Health   Financial Resource Strain: Not on file  Food Insecurity: Not on file  Transportation Needs: Not on file  Physical Activity: Not on file  Stress: Not on file  Social Connections: Not on file  Intimate Partner Violence: Not on file     Review of Systems: See HPI, otherwise negative ROS  Physical Exam: BP 130/71   Pulse 61   Temp (!) 97.5 F (36.4 C) (Temporal)   Resp 18   Ht 5\' 6"  (1.676 m)   Wt 57.6 kg   SpO2 100%   BMI 20.50 kg/m  General:   Alert, cooperative in NAD Head:  Normocephalic and atraumatic. Respiratory:  Normal work of breathing. Cardiovascular:  RRR  Impression/Plan: Victory Dakin is here for cataract surgery.  Risks, benefits, limitations, and alternatives regarding cataract surgery have been reviewed with the patient.  Questions have been answered.  All parties agreeable.   Estanislado Pandy, MD  04/11/2023, 7:21 AM

## 2023-04-11 NOTE — Transfer of Care (Signed)
Immediate Anesthesia Transfer of Care Note  Patient: Lydia Camacho  Procedure(s) Performed: CATARACT EXTRACTION PHACO AND INTRAOCULAR LENS PLACEMENT (IOC) RIGHT 8.94 00:57.2 (Right)  Patient Location: PACU  Anesthesia Type: MAC  Level of Consciousness: awake, alert  and patient cooperative  Airway and Oxygen Therapy: Patient Spontanous Breathing and Patient connected to supplemental oxygen  Post-op Assessment: Post-op Vital signs reviewed, Patient's Cardiovascular Status Stable, Respiratory Function Stable, Patent Airway and No signs of Nausea or vomiting  Post-op Vital Signs: Reviewed and stable  Complications: No notable events documented.

## 2023-04-11 NOTE — Op Note (Signed)
OPERATIVE NOTE  SUZANA KALDOR [id number] 04/11/2023   PREOPERATIVE DIAGNOSIS: Nuclear sclerotic cataract right eye. H25.11   POSTOPERATIVE DIAGNOSIS: Nuclear sclerotic cataract right eye. H25.11   PROCEDURE:  Phacoemusification with posterior chamber intraocular lens placement of the right eye  Ultrasound time: Procedure(s): CATARACT EXTRACTION PHACO AND INTRAOCULAR LENS PLACEMENT (IOC) RIGHT 8.94 00:57.2 (Right)  LENS:   Implant Name Type Inv. Item Serial No. Manufacturer Lot No. LRB No. Used Action  ENVISTA   [id number]  [id number] Right 1 Implanted      SURGEON:  Julious Payer. Rolley Sims, MD   ANESTHESIA:  Topical with tetracaine drops, augmented with 1% preservative-free intracameral lidocaine.   COMPLICATIONS:  None.   DESCRIPTION OF PROCEDURE:  The patient was identified in the holding room and transported to the operating room and placed in the supine position under the operating microscope.  The right eye was identified as the operative eye, which was prepped and draped in the usual sterile ophthalmic fashion.   A 1 millimeter clear-corneal paracentesis was made superotemporally. Preservative-free 1% lidocaine mixed with 1:1,000 bisulfite-free aqueous solution of epinephrine was injected into the anterior chamber. The anterior chamber was then filled with Viscoat viscoelastic. A 2.4 millimeter keratome was used to make a clear-corneal incision inferotemporally. A curvilinear capsulorrhexis was made with a cystotome and capsulorrhexis forceps. Balanced salt solution was used to hydrodissect and hydrodelineate the nucleus. Phacoemulsification was then used to remove the lens nucleus and epinucleus. The remaining cortex was then removed using the irrigation and aspiration handpiece. Provisc was then placed into the capsular bag to distend it for lens placement. A +22.50 [id number] intraocular lens was then injected into the capsular bag. The remaining viscoelastic was aspirated.   Wounds were  hydrated with balanced salt solution.  The anterior chamber was inflated to a physiologic pressure with balanced salt solution.  No wound leaks were noted. Vigamox was injected intracamerally.  Timolol and Brimonidine drops were applied to the eye.  The patient was taken to the recovery room in stable condition without complications of anesthesia or surgery.  Rolly Pancake Newton 04/11/2023, 7:56 AM

## 2023-04-11 NOTE — Anesthesia Postprocedure Evaluation (Signed)
Anesthesia Post Note  Patient: AVANGELINA RAVENCRAFT  Procedure(s) Performed: CATARACT EXTRACTION PHACO AND INTRAOCULAR LENS PLACEMENT (IOC) RIGHT 8.94 00:57.2 (Right)  Patient location during evaluation: PACU Anesthesia Type: MAC Level of consciousness: awake and alert Pain management: pain level controlled Vital Signs Assessment: post-procedure vital signs reviewed and stable Respiratory status: spontaneous breathing, nonlabored ventilation, respiratory function stable and patient connected to nasal cannula oxygen Cardiovascular status: stable and blood pressure returned to baseline Postop Assessment: no apparent nausea or vomiting Anesthetic complications: no   No notable events documented.   Last Vitals:  Vitals:   04/11/23 0800 04/11/23 0805  BP: 130/74 135/77  Pulse: 66 64  Resp: 14 16  Temp:  (!) 36.1 C  SpO2: 98% 98%    Last Pain:  Vitals:   04/11/23 0805  TempSrc:   PainSc: 0-No pain                 Pang Robers C [place] Mccuen

## 2023-04-12 ENCOUNTER — Encounter: Payer: Self-pay | Admitting: Ophthalmology

## 2023-04-17 ENCOUNTER — Encounter: Payer: Self-pay | Admitting: Ophthalmology

## 2023-04-17 NOTE — Anesthesia Preprocedure Evaluation (Addendum)
Anesthesia Evaluation  Patient identified by MRN, date of birth, ID band Patient awake  General Assessment Comment:  No changes in health or medications since previous cataract  Reviewed: Allergy & Precautions, H&P , NPO status , Patient's Chart, lab work & pertinent test results, reviewed documented beta blocker date and time   History of Anesthesia Complications Negative for: history of anesthetic complications  Airway Mallampati: I  TM Distance: <3 FB Neck ROM: Full    Dental  (+) Partial Upper Caps upper teeth:   Pulmonary asthma , neg sleep apnea, COPD,  COPD inhaler, Patient abstained from smoking.Not current smoker, former smoker   Pulmonary exam normal breath sounds clear to auscultation       Cardiovascular Exercise Tolerance: Good METS(-) hypertension+CHF  (-) CAD and (-) Past MI Normal cardiovascular exam(-) dysrhythmias  Rhythm:Regular Rate:Normal - Systolic murmurs Ectopic atrial tachycardia Palpitations NSVT Cardiomyopathy EF 58% Mild AR, mild to moderate TR   Imaging Results - Cardiac MRI heart stress morphology and function with and without contrast (08/06/2022 9:05 AM EDT) Narrative 08/06/2022 11:18 AM  Regadenoson cardiac MRI with and without contrast was performed on a 3.0 T scanner to evaluate myocardial morphology, function, viability, and assess for inducible myocardial ischemia in a patient  with NSVT.   1. The left ventricle is normal in cavity size and overall wall thickness. There is hypertrophy and twisting of the basal lateral wall, which is seen in mitral valve prolapse. Global systolic function is normal. The LV ejection fraction is 58%. There are no regional wall motion abnormalities.   2. The right ventricle is normal in cavity size, wall thickness, and systolic function. No regional akinetic or dyskinetic areas seen.   3. Both atria are mildly enlarged.   4. The aortic valve is trileaflet  in morphology without evidence of stenosis, there is mild aortic regurgitation. There is mild-moderate tricuspid valve regurgitation, and trivial pulmonic insufficiency. 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   5. Delayed enhancement imaging demonstrates no evidence of myocardial infarction, scar or infiltrative disease.   6. Regadenoson perfusion imaging demonstrates stress perfusion defects suggestive of inducible myocardial ischemia in two territories:   a. moderate ischemia inferior (likely RCA territory)   b. mild ischemia distal septum (likely LAD territory)           Neuro/Psych   Anxiety     negative neurological ROS  negative psych ROS   GI/Hepatic Neg liver ROS,GERD  Controlled and Medicated,,Barretts esophagus   Endo/Other  neg diabetesHypothyroidism    Renal/GU negative Renal ROS  negative genitourinary   Musculoskeletal  (+) Arthritis ,    Abdominal   Peds negative pediatric ROS (+)  Hematology negative hematology ROS (+)   Anesthesia Other Findings Thyroid disease  Asthma GERD (gastroesophageal reflux disease) Barrett esophagus Arthritis  Hypogammaglobulinemia (HCC) Phlebitis  History of vein stripping CHF (congestive heart failure)  Anxiety Mild aortic regurgitation  Moderate tricuspid regurgitation by prior echocardiogram COPD (chronic obstructive pulmonary disease) (HCC)   Previous cataract surgery 04-11-23 Hypothyroidism Osteoporosis  NSVT (nonsustained ventricular tachycardia)  Hx of iron deficiency     Reproductive/Obstetrics negative OB ROS                             Anesthesia Physical Anesthesia Plan  ASA: 3  Anesthesia Plan: MAC   Post-op Pain Management: Minimal or no pain anticipated   Induction: Intravenous  PONV Risk  Score and Plan: 2 and Midazolam  Airway Management Planned: Natural Airway and Nasal Cannula  Additional  Equipment:   Intra-op Plan:   Post-operative Plan:   Informed Consent: I have reviewed the patients History and Physical, chart, labs and discussed the procedure including the risks, benefits and alternatives for the proposed anesthesia with the patient or authorized representative who has indicated his/her understanding and acceptance.     Dental Advisory Given  Plan Discussed with: Anesthesiologist, CRNA and Surgeon  Anesthesia Plan Comments: (Explained risks of anesthesia, including PONV, and rare emergencies such as cardiac events, respiratory problems, and allergic reactions, requiring invasive intervention. Discussed the role of CRNA in patient's perioperative care. Patient understands. )        Anesthesia Quick Evaluation

## 2023-04-22 NOTE — Discharge Instructions (Signed)

## 2023-04-24 ENCOUNTER — Ambulatory Visit: Payer: Medicare HMO | Admitting: Anesthesiology

## 2023-04-24 ENCOUNTER — Other Ambulatory Visit: Payer: Self-pay

## 2023-04-24 ENCOUNTER — Encounter
Admission: RE | Disposition: A | Discharge status: Home / Self Care | Payer: Self-pay | Source: Home / Self Care | Attending: Ophthalmology

## 2023-04-24 ENCOUNTER — Ambulatory Visit
Admission: RE | Admit: 2023-04-24 | Discharge: 2023-04-24 | Disposition: A | Discharge status: Home / Self Care | Payer: Medicare HMO | Attending: Ophthalmology | Admitting: Ophthalmology

## 2023-04-24 ENCOUNTER — Encounter: Payer: Self-pay | Admitting: Ophthalmology

## 2023-04-24 DIAGNOSIS — K219 Gastro-esophageal reflux disease without esophagitis: Secondary | ICD-10-CM | POA: Insufficient documentation

## 2023-04-24 DIAGNOSIS — J449 Chronic obstructive pulmonary disease, unspecified: Secondary | ICD-10-CM | POA: Insufficient documentation

## 2023-04-24 DIAGNOSIS — E039 Hypothyroidism, unspecified: Secondary | ICD-10-CM | POA: Diagnosis not present

## 2023-04-24 DIAGNOSIS — I082 Rheumatic disorders of both aortic and tricuspid valves: Secondary | ICD-10-CM | POA: Diagnosis not present

## 2023-04-24 DIAGNOSIS — Z79899 Other long term (current) drug therapy: Secondary | ICD-10-CM | POA: Diagnosis not present

## 2023-04-24 DIAGNOSIS — H2512 Age-related nuclear cataract, left eye: Secondary | ICD-10-CM | POA: Diagnosis present

## 2023-04-24 DIAGNOSIS — I509 Heart failure, unspecified: Secondary | ICD-10-CM | POA: Diagnosis not present

## 2023-04-24 DIAGNOSIS — Z87891 Personal history of nicotine dependence: Secondary | ICD-10-CM | POA: Insufficient documentation

## 2023-04-24 DIAGNOSIS — Z79890 Hormone replacement therapy (postmenopausal): Secondary | ICD-10-CM | POA: Diagnosis not present

## 2023-04-24 DIAGNOSIS — F419 Anxiety disorder, unspecified: Secondary | ICD-10-CM | POA: Diagnosis not present

## 2023-04-24 HISTORY — PX: CATARACT EXTRACTION W/PHACO: SHX586

## 2023-04-24 SURGERY — PHACOEMULSIFICATION, CATARACT, WITH IOL INSERTION
Anesthesia: Monitor Anesthesia Care | Site: Eye | Laterality: Left

## 2023-04-24 MED ORDER — MIDAZOLAM HCL 2 MG/2ML IJ SOLN
INTRAMUSCULAR | Status: DC | PRN
  Administered 2023-04-24 (×2): 1 mg via INTRAVENOUS

## 2023-04-24 MED ORDER — LIDOCAINE HCL (PF) 2 % IJ SOLN
INTRAOCULAR | Status: DC | PRN
  Administered 2023-04-24: 4 mL via INTRAOCULAR

## 2023-04-24 MED ORDER — MOXIFLOXACIN HCL 0.5 % OP SOLN
OPHTHALMIC | Status: DC | PRN
  Administered 2023-04-24: .2 mL via OPHTHALMIC

## 2023-04-24 MED ORDER — BRIMONIDINE TARTRATE-TIMOLOL 0.2-0.5 % OP SOLN
OPHTHALMIC | Status: DC | PRN
  Administered 2023-04-24: 1 [drp] via OPHTHALMIC

## 2023-04-24 MED ORDER — SIGHTPATH DOSE#1 BSS IO SOLN
INTRAOCULAR | Status: DC | PRN
  Administered 2023-04-24: 15 mL via INTRAOCULAR

## 2023-04-24 MED ORDER — TETRACAINE HCL 0.5 % OP SOLN
1.0000 [drp] | OPHTHALMIC | Status: DC | PRN
  Administered 2023-04-24 (×3): 1 [drp] via OPHTHALMIC

## 2023-04-24 MED ORDER — LACTATED RINGERS IV SOLN: INTRAVENOUS | Status: DC

## 2023-04-24 MED ORDER — TETRACAINE 0.5 % OP SOLN OPTIME - NO CHARGE
OPHTHALMIC | Status: DC | PRN
  Administered 2023-04-24: 2 [drp] via OPHTHALMIC

## 2023-04-24 MED ORDER — ARMC OPHTHALMIC DILATING DROPS
1.0000 | OPHTHALMIC | Status: DC | PRN
  Administered 2023-04-24 (×3): 1 via OPHTHALMIC

## 2023-04-24 MED ORDER — FENTANYL CITRATE (PF) 100 MCG/2ML IJ SOLN
INTRAMUSCULAR | Status: DC | PRN
  Administered 2023-04-24: 50 ug via INTRAVENOUS

## 2023-04-24 MED ORDER — SIGHTPATH DOSE#1 BSS IO SOLN
INTRAOCULAR | Status: DC | PRN
  Administered 2023-04-24: 59 mL via OPHTHALMIC

## 2023-04-24 MED ORDER — SIGHTPATH DOSE#1 NA HYALUR & NA CHOND-NA HYALUR IO KIT
PACK | INTRAOCULAR | Status: DC | PRN
  Administered 2023-04-24: 1 via OPHTHALMIC

## 2023-04-24 SURGICAL SUPPLY — 11 items
CATARACT SUITE SIGHTPATH (MISCELLANEOUS) ×1 IMPLANT
DISSECTOR HYDRO NUCLEUS 50X22 (MISCELLANEOUS) ×1 IMPLANT
DRSG TEGADERM 2-3/8X2-3/4 SM (GAUZE/BANDAGES/DRESSINGS) ×1 IMPLANT
FEE CATARACT SUITE SIGHTPATH (MISCELLANEOUS) ×1 IMPLANT
GLOVE SURG SYN 7.5 E (GLOVE) ×1 IMPLANT
GLOVE SURG SYN 7.5 PF PI (GLOVE) ×1 IMPLANT
GLOVE SURG SYN 8.5 E (GLOVE) ×1 IMPLANT
GLOVE SURG SYN 8.5 PF PI (GLOVE) ×1 IMPLANT
LENS ENVISTA  MX60E 23.0 (Intraocular Lens) ×1 IMPLANT
LENS ENVISTA 23.0 (Intraocular Lens) ×1 IMPLANT
LENS IOL ENVISTA UV+ 23.0 (Intraocular Lens) IMPLANT

## 2023-04-24 NOTE — Op Note (Signed)
OPERATIVE NOTE  [name] [id number] 04/24/2023   PREOPERATIVE DIAGNOSIS: Nuclear sclerotic cataract left eye. H25.12   POSTOPERATIVE DIAGNOSIS: Nuclear sclerotic cataract left eye. H25.12   PROCEDURE:  Phacoemusification with posterior chamber intraocular lens placement of the left eye  Ultrasound time: Procedure(s): CATARACT EXTRACTION PHACO AND INTRAOCULAR LENS PLACEMENT (IOC) LEFT 8.22 00:56.2 (Left)  LENS:   Implant Name Type Inv. Item Serial No. Manufacturer Lot No. LRB No. Used Action  LENS ENVISTA  [id number] 23.0 - [id number] Intraocular Lens LENS ENVISTA  [id number] 23.0 [id number] [employer]  Left 1 Implanted      SURGEON:  Julious Payer. Rolley Sims, MD   ANESTHESIA:  Topical with tetracaine drops, augmented with 1% preservative-free intracameral lidocaine.   COMPLICATIONS:  None.   DESCRIPTION OF PROCEDURE:  The patient was identified in the holding room and transported to the operating room and placed in the supine position under the operating microscope.  The left eye was identified as the operative eye, which was prepped and draped in the usual sterile ophthalmic fashion.   A 1 millimeter clear-corneal paracentesis was made inferotemporally. Preservative-free 1% lidocaine mixed with 1:1,000 bisulfite-free aqueous solution of epinephrine was injected into the anterior chamber. The anterior chamber was then filled with Viscoat viscoelastic. A 2.4 millimeter keratome was used to make a clear-corneal incision superotemporally. A curvilinear capsulorrhexis was made with a cystotome and capsulorrhexis forceps. Balanced salt solution was used to hydrodissect and hydrodelineate the nucleus. Phacoemulsification was then used to remove the lens nucleus and epinucleus. The remaining cortex was then removed using the irrigation and aspiration handpiece. Provisc was then placed into the capsular bag to distend it for lens placement. A +23.00 D [id number] intraocular lens was then injected into the capsular  bag. The remaining viscoelastic was aspirated.   Wounds were hydrated with balanced salt solution.  The anterior chamber was inflated to a physiologic pressure with balanced salt solution.  No wound leaks were noted. Vigamox was injected intracamerally.  Timolol and Brimonidine drops were applied to the eye.  The patient was taken to the recovery room in stable condition without complications of anesthesia or surgery.  [name] 04/24/2023, 10:54 AM

## 2023-04-24 NOTE — Transfer of Care (Signed)
Immediate Anesthesia Transfer of Care Note  Patient: Lydia Camacho  Procedure(s) Performed: CATARACT EXTRACTION PHACO AND INTRAOCULAR LENS PLACEMENT (IOC) LEFT 8.22 00:56.2 (Left: Eye)  Patient Location: PACU  Anesthesia Type: MAC  Level of Consciousness: awake, alert  and patient cooperative  Airway and Oxygen Therapy: Patient Spontanous Breathing and Patient connected to supplemental oxygen  Post-op Assessment: Post-op Vital signs reviewed, Patient's Cardiovascular Status Stable, Respiratory Function Stable, Patent Airway and No signs of Nausea or vomiting  Post-op Vital Signs: Reviewed and stable  Complications: No notable events documented.

## 2023-04-24 NOTE — H&P (Signed)
[facility]   Primary Care Physician:  Enid Baas, MD Ophthalmologist: Dr. Deberah Pelton  Pre-Procedure History & Physical: HPI:  Lydia Camacho is a 72 y.o. female here for cataract surgery.   Past Medical History:  Diagnosis Date   Anxiety    Arthritis    Asthma    Barrett esophagus    CHF (congestive heart failure) (HCC)    COPD (chronic obstructive pulmonary disease) (HCC)    GERD (gastroesophageal reflux disease)    History of vein stripping    Hx of iron deficiency    Hypogammaglobulinemia (HCC)    Hypothyroidism    Mild aortic regurgitation    Moderate tricuspid regurgitation by prior echocardiogram    NSVT (nonsustained ventricular tachycardia) (HCC)    Osteoporosis    Phlebitis    Thyroid disease     Past Surgical History:  Procedure Laterality Date   ANKLE SURGERY     BREAST BIOPSY Right    Lymph node removed   BREAST BIOPSY Left 2006   In [place]   BREAST BIOPSY Left 2006   In [place]   CATARACT EXTRACTION W/PHACO Right 04/11/2023   Procedure: CATARACT EXTRACTION PHACO AND INTRAOCULAR LENS PLACEMENT (IOC) RIGHT 8.94 00:57.2;  Surgeon: Estanislado Pandy, MD;  Location: [facility];  Service: Ophthalmology;  Laterality: Right;   HEMORRHOID SURGERY     TONSILLECTOMY      Prior to Admission medications   Medication Sig Start Date End Date Taking? Authorizing Provider  albuterol (PROVENTIL HFA;VENTOLIN HFA) 108 (90 Base) MCG/ACT inhaler Inhale 2 puffs into the lungs every 4 (four) hours as needed for wheezing or shortness of breath.   Yes [provider]  Calcium Carb-Cholecalciferol (CALTRATE 600+D3 SOFT) 600-20 MG-MCG CHEW Chew by mouth.   Yes [provider]  Cholecalciferol 25 MCG (1000 UT) capsule Take by mouth.   Yes [provider]  cyanocobalamin 1000 MCG tablet Take by mouth.   Yes [provider]  cyclobenzaprine (FLEXERIL) 5 MG tablet Take 5 mg by mouth 3 (three) times daily as needed  for muscle spasms.   Yes [provider]  fluticasone (FLONASE) 50 MCG/ACT nasal spray Place 2 sprays into both nostrils daily.   Yes [provider]  levothyroxine (SYNTHROID) 88 MCG tablet Take 88 mcg by mouth daily. 12/22/21  Yes [provider]  metoprolol tartrate (LOPRESSOR) 25 MG tablet Take 12.5 mg by mouth daily. 12/22/21  Yes [provider]  mometasone (ELOCON) 0.1 % cream Apply topically 2 (two) times daily. 09/05/21  Yes [provider]  montelukast (SINGULAIR) 10 MG tablet Take 1 tablet by mouth daily. 05/05/15  Yes [provider]  Multiple Vitamins-Minerals (ONE-A-DAY WOMENS PO) Take 2 tablets by mouth daily.   Yes [provider]  NEOMYCIN-POLYMYXIN-HYDROCORTISONE (CORTISPORIN) 1 % SOLN OTIC solution  11/01/21  Yes [provider]  pantoprazole (PROTONIX) 20 MG tablet Take by mouth. 12/21/21  Yes [provider]  Probiotic Product (PROBIOTIC PO) Take 1 capsule by mouth daily.   Yes [provider]  pyridoxine (B-6) 500 MG tablet Take by mouth.   Yes [provider]  valACYclovir (VALTREX) 500 MG tablet Take 500 mg by mouth 2 (two) times daily.   Yes [provider]  acetaminophen (TYLENOL) 500 MG tablet Take by mouth. Patient not taking: Reported on 04/03/2023    [provider]  CANNABIDIOL PO Take by mouth. Patient not taking: Reported on 04/03/2023    [provider]  diphenhydrAMINE (SOMINEX) 25 MG tablet Take by mouth. Patient not taking: Reported on 04/03/2023    [provider]  EPINEPHrine 0.3 mg/0.3 mL IJ SOAJ injection Inject 0.3 mg into the muscle once.    [provider]  fexofenadine (ALLEGRA) 60 MG tablet Take by mouth. Patient not taking: Reported on 04/03/2023    [provider]  ibuprofen (ADVIL) 200 MG tablet Take by mouth. Patient not taking: Reported on 04/03/2023    [provider]  Misc Natural Products  (T-RELIEF CBD+13) SUBL Place under the tongue. Patient not taking: Reported on 04/03/2023    [provider]  predniSONE (DELTASONE) 20 MG tablet Take 2 tablets (40 mg total) by mouth daily. Patient not taking: Reported on 04/03/2023 02/18/22   Valinda Hoar, NP  triamcinolone (NASACORT) 55 MCG/ACT AERO nasal inhaler Place into the nose. Patient not taking: Reported on 04/03/2023    [provider]    Allergies as of 03/22/2023 - Review Complete 06/05/2022  Allergen Reaction Noted   Bee venom Anaphylaxis 12/29/2015   Clindamycin hcl Anaphylaxis 01/08/2022   Clindamycin/lincomycin Anaphylaxis 12/29/2015   Erythromycin Anaphylaxis 12/29/2015   Other Anaphylaxis and Other (See Comments) 11/05/1968   Penicillins Anaphylaxis 12/29/2015   Sodium tetradecyl sulfate Anaphylaxis 12/29/2015   Sulfa antibiotics Anaphylaxis 12/29/2015   Cat hair extract  11/05/2013   Red dye Itching 04/15/2020   Soap & cleansers [albolene] Itching and Rash 11/05/1958    Family History  Problem Relation Age of Onset   Breast cancer Paternal Aunt 33   Deep vein thrombosis Mother    Cancer Mother     Social History   Socioeconomic History   Marital status: Widowed    Spouse name: Not on file   Number of children: Not on file   Years of education: Not on file   Highest education level: Not on file  Occupational History   Not on file  Tobacco Use   Smoking status: Former   Smokeless tobacco: Never  Vaping Use   Vaping Use: Never used  Substance and Sexual Activity   Alcohol use: Yes    Alcohol/week: 1.0 standard drink of alcohol    Types: 1 Glasses of wine per week    Comment: Occ.   Drug use: No   Sexual activity: Not on file  Other Topics Concern   Not on file  Social History Narrative   Not on file   Social Determinants of Health   Financial Resource Strain: Not on file  Food Insecurity: Not on file  Transportation Needs: Not on file  Physical Activity: Not on file   Stress: Not on file  Social Connections: Not on file  Intimate Partner Violence: Not on file    Review of Systems: See HPI, otherwise negative ROS  Physical Exam: There were no vitals taken for this visit. General:   Alert, cooperative in NAD Head:  Normocephalic and atraumatic. Respiratory:  Normal work of breathing. Cardiovascular:  RRR  Impression/Plan: Lydia Camacho is here for cataract surgery.  Risks, benefits, limitations, and alternatives regarding cataract surgery have been reviewed with the patient.  Questions have been answered.  All parties agreeable.   Estanislado Pandy, MD  04/24/2023, 7:04 AM

## 2023-04-24 NOTE — Anesthesia Postprocedure Evaluation (Signed)
Anesthesia Post Note  Patient: Lydia Camacho  Procedure(s) Performed: CATARACT EXTRACTION PHACO AND INTRAOCULAR LENS PLACEMENT (IOC) LEFT 8.22 00:56.2 (Left: Eye)  Patient location during evaluation: PACU Anesthesia Type: MAC Level of consciousness: awake and alert Pain management: pain level controlled Vital Signs Assessment: post-procedure vital signs reviewed and stable Respiratory status: spontaneous breathing, nonlabored ventilation, respiratory function stable and patient connected to nasal cannula oxygen Cardiovascular status: stable and blood pressure returned to baseline Postop Assessment: no apparent nausea or vomiting Anesthetic complications: no   No notable events documented.   Last Vitals:  Vitals:   04/24/23 1055 04/24/23 1100  BP: (!) 132/92 132/83  Pulse: (!) 56 62  Resp: 13 11  Temp: (!) 36.2 C (!) 36.2 C  SpO2: 100% 100%    Last Pain:  Vitals:   04/24/23 1100  TempSrc:   PainSc: 0-No pain                 Corinda Gubler

## 2023-04-25 ENCOUNTER — Encounter: Payer: Self-pay | Admitting: Ophthalmology

## 2023-12-03 ENCOUNTER — Ambulatory Visit: Payer: Medicare HMO

## 2023-12-03 DIAGNOSIS — K640 First degree hemorrhoids: Secondary | ICD-10-CM | POA: Diagnosis not present

## 2023-12-03 DIAGNOSIS — Z1211 Encounter for screening for malignant neoplasm of colon: Secondary | ICD-10-CM | POA: Diagnosis present

## 2023-12-03 DIAGNOSIS — Z83719 Family history of colon polyps, unspecified: Secondary | ICD-10-CM | POA: Diagnosis not present

## 2023-12-03 DIAGNOSIS — K5730 Diverticulosis of large intestine without perforation or abscess without bleeding: Secondary | ICD-10-CM | POA: Diagnosis not present

## 2024-02-18 ENCOUNTER — Inpatient Hospital Stay
Admission: RE | Admit: 2024-02-18 | Discharge: 2024-02-18 | Disposition: A | Discharge status: Home / Self Care | Payer: Self-pay | Source: Ambulatory Visit | Attending: Internal Medicine | Admitting: Internal Medicine

## 2024-02-18 ENCOUNTER — Other Ambulatory Visit: Payer: Self-pay | Admitting: *Deleted

## 2024-02-18 ENCOUNTER — Other Ambulatory Visit: Payer: Self-pay | Admitting: Internal Medicine

## 2024-02-18 DIAGNOSIS — Z1231 Encounter for screening mammogram for malignant neoplasm of breast: Secondary | ICD-10-CM

## 2024-02-18 DIAGNOSIS — N644 Mastodynia: Secondary | ICD-10-CM

## 2024-02-18 DIAGNOSIS — R590 Localized enlarged lymph nodes: Secondary | ICD-10-CM

## 2024-02-18 DIAGNOSIS — Z87898 Personal history of other specified conditions: Secondary | ICD-10-CM

## 2024-02-27 ENCOUNTER — Other Ambulatory Visit: Payer: Self-pay | Admitting: Internal Medicine

## 2024-02-27 ENCOUNTER — Ambulatory Visit
Admission: RE | Admit: 2024-02-27 | Discharge: 2024-02-27 | Disposition: A | Discharge status: Home / Self Care | Source: Ambulatory Visit | Attending: Internal Medicine | Admitting: Internal Medicine

## 2024-02-27 DIAGNOSIS — N644 Mastodynia: Secondary | ICD-10-CM

## 2024-02-27 DIAGNOSIS — R590 Localized enlarged lymph nodes: Secondary | ICD-10-CM

## 2024-02-27 DIAGNOSIS — Z87898 Personal history of other specified conditions: Secondary | ICD-10-CM

## 2024-09-30 ENCOUNTER — Ambulatory Visit: Admission: EM | Admit: 2024-09-30 | Discharge: 2024-09-30

## 2024-09-30 DIAGNOSIS — S0011XA Contusion of right eyelid and periocular area, initial encounter: Secondary | ICD-10-CM | POA: Diagnosis not present

## 2024-09-30 DIAGNOSIS — S0990XA Unspecified injury of head, initial encounter: Secondary | ICD-10-CM

## 2024-09-30 NOTE — ED Triage Notes (Signed)
 Patient sates that she didn't fall. She states that she hit her bathroom door injuring her right eye. Patient isnt on blood thinners. Patient states that she walked into the door.

## 2024-09-30 NOTE — Discharge Instructions (Addendum)
 Please go to Er for further evaluation of head injury, eye injury
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
